# Patient Record
Sex: Female | Born: 1985 | Race: Black or African American | Hispanic: No | Marital: Single | Smoking: Former smoker
Health system: Southern US, Community
[De-identification: ages and names within clinical notes are randomized; demographics above are authoritative.]

## PROBLEM LIST (undated history)

## (undated) DIAGNOSIS — J45909 Unspecified asthma, uncomplicated: Secondary | ICD-10-CM

## (undated) DIAGNOSIS — R7303 Prediabetes: Secondary | ICD-10-CM

## (undated) DIAGNOSIS — F419 Anxiety disorder, unspecified: Secondary | ICD-10-CM

## (undated) DIAGNOSIS — J40 Bronchitis, not specified as acute or chronic: Secondary | ICD-10-CM

## (undated) DIAGNOSIS — F909 Attention-deficit hyperactivity disorder, unspecified type: Secondary | ICD-10-CM

## (undated) DIAGNOSIS — E559 Vitamin D deficiency, unspecified: Secondary | ICD-10-CM

## (undated) HISTORY — DX: Prediabetes: R73.03

## (undated) HISTORY — DX: Attention-deficit hyperactivity disorder, unspecified type: F90.9

## (undated) HISTORY — DX: Anxiety disorder, unspecified: F41.9

## (undated) HISTORY — DX: Unspecified asthma, uncomplicated: J45.909

## (undated) HISTORY — PX: NO PAST SURGERIES: SHX2092

## (undated) HISTORY — DX: Vitamin D deficiency, unspecified: E55.9

---

## 2006-02-24 ENCOUNTER — Emergency Department (HOSPITAL_COMMUNITY): Admission: EM | Admit: 2006-02-24 | Discharge: 2006-02-24 | Payer: Self-pay | Admitting: *Deleted

## 2006-07-30 ENCOUNTER — Emergency Department (HOSPITAL_COMMUNITY): Admission: EM | Admit: 2006-07-30 | Discharge: 2006-07-30 | Payer: Self-pay | Admitting: Emergency Medicine

## 2007-01-14 ENCOUNTER — Emergency Department (HOSPITAL_COMMUNITY): Admission: EM | Admit: 2007-01-14 | Discharge: 2007-01-14 | Payer: Self-pay | Admitting: Emergency Medicine

## 2014-02-02 ENCOUNTER — Emergency Department (HOSPITAL_COMMUNITY)
Admission: EM | Admit: 2014-02-02 | Discharge: 2014-02-02 | Disposition: A | Discharge status: Home / Self Care | Payer: 59 | Attending: Emergency Medicine | Admitting: Emergency Medicine

## 2014-02-02 ENCOUNTER — Encounter (HOSPITAL_COMMUNITY): Payer: Self-pay | Admitting: Emergency Medicine

## 2014-02-02 DIAGNOSIS — J302 Other seasonal allergic rhinitis: Secondary | ICD-10-CM | POA: Diagnosis present

## 2014-02-02 DIAGNOSIS — H938X9 Other specified disorders of ear, unspecified ear: Secondary | ICD-10-CM | POA: Insufficient documentation

## 2014-02-02 DIAGNOSIS — Z87891 Personal history of nicotine dependence: Secondary | ICD-10-CM | POA: Insufficient documentation

## 2014-02-02 DIAGNOSIS — L0100 Impetigo, unspecified: Secondary | ICD-10-CM | POA: Diagnosis present

## 2014-02-02 DIAGNOSIS — J309 Allergic rhinitis, unspecified: Secondary | ICD-10-CM | POA: Insufficient documentation

## 2014-02-02 MED ORDER — MUPIROCIN 2 % EX OINT: TOPICAL_OINTMENT | CUTANEOUS | Status: AC

## 2014-02-02 MED ORDER — FLUTICASONE PROPIONATE 50 MCG/ACT NA SUSP: 2.0000 | Freq: Every day | NASAL | Status: DC

## 2014-02-02 MED ORDER — CETIRIZINE HCL 10 MG PO TABS: 10.0000 mg | ORAL_TABLET | Freq: Every day | ORAL | Status: DC

## 2014-02-02 NOTE — ED Provider Notes (Signed)
CSN: [id number]     Arrival date & time 02/02/14  0754 History   First MD Initiated Contact with Patient 02/02/14 [phone]     Chief Complaint  Patient presents with  . URI     (Consider location/radiation/quality/duration/timing/severity/associated sxs/prior Treatment) Patient is a 28 y.o. female presenting with URI. The history is provided by the patient.  URI Presenting symptoms: rhinorrhea and sore throat   Presenting symptoms: no congestion, no cough, no fatigue and no fever   Rhinorrhea:    Quality:  Clear   Severity:  Moderate   Duration:  2 weeks   Timing:  Constant   Progression:  Unchanged Sore throat:    Severity:  Mild   Onset quality:  Gradual   Duration:  1 week   Timing:  Constant   Progression:  Partially resolved Severity:  Mild Onset quality:  Gradual Timing:  Constant Progression:  Partially resolved Chronicity:  New Relieved by:  Nothing Worsened by:  Nothing tried Ineffective treatments: chloraseptic spray. Associated symptoms: sneezing   Associated symptoms: no headaches and no neck pain     History reviewed. No pertinent past medical history. History reviewed. No pertinent past surgical history. No family history on file. History  Substance Use Topics  . Smoking status: Former Games developermoker  . Smokeless tobacco: Not on file  . Alcohol Use: Not on file     Comment: social   OB History   Grav Para Term Preterm Abortions TAB SAB Ect Mult Living                 Review of Systems  Constitutional: Negative for fever and fatigue.  HENT: Positive for rhinorrhea, sneezing and sore throat. Negative for congestion and drooling.   Eyes: Negative for pain.  Respiratory: Negative for cough and shortness of breath.   Cardiovascular: Negative for chest pain.  Gastrointestinal: Negative for nausea, vomiting, abdominal pain and diarrhea.  Genitourinary: Negative for dysuria and hematuria.  Musculoskeletal: Negative for back pain, gait problem and neck pain.   Skin: Negative for color change.       Lesion under nose  Neurological: Negative for dizziness and headaches.  Hematological: Negative for adenopathy.  Psychiatric/Behavioral: Negative for behavioral problems.  All other systems reviewed and are negative.     Allergies  Review of patient's allergies indicates no known allergies.  Home Medications   Prior to Admission medications   Medication Sig Start Date End Date Taking? Authorizing Provider  cetirizine (ZYRTEC) 10 MG tablet Take 1 tablet (10 mg total) by mouth daily. 02/02/14   Junius ArgyleForrest S Christepher Melchior, MD  fluticasone (FLONASE) 50 MCG/ACT nasal spray Place 2 sprays into both nostrils daily. 02/02/14 02/12/14  Junius ArgyleForrest S Dorman Calderwood, MD  mupirocin ointment (BACTROBAN) 2 % Apply to affected area 3 times daily for 5 days. 02/02/14 02/06/14  Junius ArgyleForrest S Anahy Esh, MD   BP 136/87  Pulse 99  Temp(Src) 98.8 F (37.1 C) (Oral)  Resp 12  SpO2 100%  LMP 01/10/2014 Physical Exam  Nursing note and vitals reviewed. Constitutional: She is oriented to person, place, and time. She appears well-developed and well-nourished.  HENT:  Head: Normocephalic and atraumatic.  Mouth/Throat: Oropharynx is clear and moist. No oropharyngeal exudate.  Mild erythema and swelling to the helix of the right ear.   TM's clear bilaterally.   Mildly swollen tonsils bilaterally without asymmetry.  Mildly tender anterior cervical lymphadenopathy.  Mild honey-colored crusting lesions to the left of the philtrum under the nose.  Eyes: Conjunctivae and EOM  are normal. Pupils are equal, round, and reactive to light.  Neck: Normal range of motion. Neck supple.  Cardiovascular: Normal rate, regular rhythm, normal heart sounds and intact distal pulses.  Exam reveals no gallop and no friction rub.   No murmur heard. Pulmonary/Chest: Effort normal and breath sounds normal. No respiratory distress. She has no wheezes.  Abdominal: Soft. Bowel sounds are normal. There is no  tenderness. There is no rebound and no guarding.  Musculoskeletal: Normal range of motion. She exhibits no edema and no tenderness.  Neurological: She is alert and oriented to person, place, and time.  Skin: Skin is warm and dry.  Psychiatric: She has a normal mood and affect. Her behavior is normal.    ED Course  Procedures (including critical care time) Labs Review Labs Reviewed - No data to display  Imaging Review No results found.   EKG Interpretation None      MDM   Final diagnoses:  Seasonal allergies  Impetigo    8:25 AM 28 y.o. female who presents with sore throat, dry cough, itching, rhinorrhea for approximately 1.5 weeks. She denies any fevers, vomiting, diarrhea. Sore throat now improved and almost gone. She has several honey-colored dry crusting lesions just to the left of the philtrum under her nose. She is afebrile and vital signs are unremarkable here. She's been using over-the-counter medicines with mild relief. Likely seasonal allergies. Will recommend daily Zyrtec, Flonase, and mupirocin for likely impetigo.  8:27 AM:  I have discussed the diagnosis/risks/treatment options with the patient and believe the pt to be eligible for discharge home to follow-up with pcp as needed. We also discussed returning to the ED immediately if new or worsening sx occur. We discussed the sx which are most concerning (e.g., fever, sob) that necessitate immediate return. Medications administered to the patient during their visit and any new prescriptions provided to the patient are listed below.  Medications given during this visit Medications - No data to display  New Prescriptions   CETIRIZINE (ZYRTEC) 10 MG TABLET    Take 1 tablet (10 mg total) by mouth daily.   FLUTICASONE (FLONASE) 50 MCG/ACT NASAL SPRAY    Place 2 sprays into both nostrils daily.   MUPIROCIN OINTMENT (BACTROBAN) 2 %    Apply to affected area 3 times daily for 5 days.       Junius ArgyleForrest S Karenann Mcgrory,  MD 02/02/14 0830

## 2014-02-02 NOTE — ED Notes (Signed)
Cold symptoms since Thursday; sore throat Tuesday; sore above top lip; runny nose; chest congestion

## 2014-02-02 NOTE — Discharge Instructions (Signed)
Allergic Rhinitis Allergic rhinitis is when the mucous membranes in the nose respond to allergens. Allergens are particles in the air that cause your body to have an allergic reaction. This causes you to release allergic antibodies. Through a chain of events, these eventually cause you to release histamine into the blood stream. Although meant to protect the body, it is this release of histamine that causes your discomfort, such as frequent sneezing, congestion, and an itchy, runny nose.  CAUSES  Seasonal allergic rhinitis (hay fever) is caused by pollen allergens that may come from grasses, trees, and weeds. Year-round allergic rhinitis (perennial allergic rhinitis) is caused by allergens such as house dust mites, pet dander, and mold spores.  SYMPTOMS   Nasal stuffiness (congestion).  Itchy, runny nose with sneezing and tearing of the eyes. DIAGNOSIS  Your health care provider can help you determine the allergen or allergens that trigger your symptoms. If you and your health care provider are unable to determine the allergen, skin or blood testing may be used. TREATMENT  Allergic Rhinitis does not have a cure, but it can be controlled by:  Medicines and allergy shots (immunotherapy).  Avoiding the allergen. Hay fever may often be treated with antihistamines in pill or nasal spray forms. Antihistamines block the effects of histamine. There are over-the-counter medicines that may help with nasal congestion and swelling around the eyes. Check with your health care provider before taking or giving this medicine.  If avoiding the allergen or the medicine prescribed do not work, there are many new medicines your health care provider can prescribe. Stronger medicine may be used if initial measures are ineffective. Desensitizing injections can be used if medicine and avoidance does not work. Desensitization is when a patient is given ongoing shots until the body becomes less sensitive to the allergen.  Make sure you follow up with your health care provider if problems continue. HOME CARE INSTRUCTIONS It is not possible to completely avoid allergens, but you can reduce your symptoms by taking steps to limit your exposure to them. It helps to know exactly what you are allergic to so that you can avoid your specific triggers. SEEK MEDICAL CARE IF:   You have a fever.  You develop a cough that does not stop easily (persistent).  You have shortness of breath.  You start wheezing.  Symptoms interfere with normal daily activities. Document Released: 06/24/2001 Document Revised: 07/20/2013 Document Reviewed: 06/06/2013 Exit[employer] Patient Information 2014 [employer].  

## 2014-03-29 ENCOUNTER — Encounter: Payer: Self-pay | Admitting: *Deleted

## 2014-04-19 ENCOUNTER — Encounter: Payer: Self-pay | Admitting: Obstetrics and Gynecology

## 2014-05-04 ENCOUNTER — Encounter (HOSPITAL_COMMUNITY): Payer: Self-pay | Admitting: Emergency Medicine

## 2014-05-04 ENCOUNTER — Emergency Department (HOSPITAL_COMMUNITY)
Admission: EM | Admit: 2014-05-04 | Discharge: 2014-05-04 | Disposition: A | Discharge status: Home / Self Care | Payer: 59 | Attending: Emergency Medicine | Admitting: Emergency Medicine

## 2014-05-04 DIAGNOSIS — Z8709 Personal history of other diseases of the respiratory system: Secondary | ICD-10-CM | POA: Insufficient documentation

## 2014-05-04 DIAGNOSIS — Z87891 Personal history of nicotine dependence: Secondary | ICD-10-CM | POA: Insufficient documentation

## 2014-05-04 DIAGNOSIS — Z79899 Other long term (current) drug therapy: Secondary | ICD-10-CM | POA: Insufficient documentation

## 2014-05-04 DIAGNOSIS — L509 Urticaria, unspecified: Secondary | ICD-10-CM

## 2014-05-04 DIAGNOSIS — IMO0002 Reserved for concepts with insufficient information to code with codable children: Secondary | ICD-10-CM | POA: Insufficient documentation

## 2014-05-04 HISTORY — DX: Bronchitis, not specified as acute or chronic: J40

## 2014-05-04 MED ORDER — DEXAMETHASONE SODIUM PHOSPHATE 10 MG/ML IJ SOLN
10.0000 mg | Freq: Once | INTRAMUSCULAR | Status: AC
  Administered 2014-05-04: 10 mg via INTRAMUSCULAR
  Filled 2014-05-04: qty 1

## 2014-05-04 NOTE — ED Provider Notes (Signed)
Medical screening examination/treatment/procedure(s) were performed by non-physician practitioner and as supervising physician I was immediately available for consultation/collaboration.   EKG Interpretation None       Charbel Los M Shalae Belmonte, MD 05/04/14 0555 

## 2014-05-04 NOTE — ED Notes (Signed)
Pt from home c/o a itch rash that started today at work around 1400. She took two benadryl at 9:00 pm and calamine lotion with minimal relief. She can't recall any new items that she has used.

## 2014-05-04 NOTE — Discharge Instructions (Signed)
Continue to take Benadryl for itching. Followup with a primary care provider for continued evaluation and possible allergy testing.    Hives Hives are itchy, red, swollen areas of the skin. They can vary in size and location on your body. Hives can come and go for hours or several days (acute hives) or for several weeks (chronic hives). Hives do not spread from person to person (noncontagious). They may get worse with scratching, exercise, and emotional stress. CAUSES   Allergic reaction to food, additives, or drugs.  Infections, including the common cold.  Illness, such as vasculitis, lupus, or thyroid disease.  Exposure to sunlight, heat, or cold.  Exercise.  Stress.  Contact with chemicals. SYMPTOMS   Red or white swollen patches on the skin. The patches may change size, shape, and location quickly and repeatedly.  Itching.  Swelling of the hands, feet, and face. This may occur if hives develop deeper in the skin. DIAGNOSIS  Your caregiver can usually tell what is wrong by performing a physical exam. Skin or blood tests may also be done to determine the cause of your hives. In some cases, the cause cannot be determined. TREATMENT  Mild cases usually get better with medicines such as antihistamines. Severe cases may require an emergency epinephrine injection. If the cause of your hives is known, treatment includes avoiding that trigger.  HOME CARE INSTRUCTIONS   Avoid causes that trigger your hives.  Take antihistamines as directed by your caregiver to reduce the severity of your hives. Non-sedating or low-sedating antihistamines are usually recommended. Do not drive while taking an antihistamine.  Take any other medicines prescribed for itching as directed by your caregiver.  Wear loose-fitting clothing.  Keep all follow-up appointments as directed by your caregiver. SEEK MEDICAL CARE IF:   You have persistent or severe itching that is not relieved with  medicine.  You have painful or swollen joints. SEEK IMMEDIATE MEDICAL CARE IF:   You have a fever.  Your tongue or lips are swollen.  You have trouble breathing or swallowing.  You feel tightness in the throat or chest.  You have abdominal pain. These problems may be the first sign of a life-threatening allergic reaction. Call your local emergency services (911 in [place] MAKE SURE YOU:   Understand these instructions.  Will watch your condition.  Will get help right away if you are not doing well or get worse. Document Released: 09/29/2005 Document Revised: 10/04/2013 Document Reviewed: 12/23/2011 [facility] LP[facility] Patient Information 2015 [place], [place]. This information is not intended to replace advice given to you by your health care provider. Make sure you discuss any questions you have with your health care provider.

## 2014-05-04 NOTE — ED Provider Notes (Signed)
CSN: [id number]     Arrival date & time 05/04/14  0008 History   First MD Initiated Contact with Patient 05/04/14 0130     Chief Complaint  Patient presents with  . Rash   HPI  History provided by the patient. Patient is a 28 year old female with no significant PMH presents with concerns for pruritic rash to the skin. Patient reports having slight itching of the skin towards the end of her workday. When she returned home she had much more itching to her upper body and extremities. She did take some Benadryl in a warm shower. After her shower she had worsened itching and rash to the skin. She did put calamine lotion on but has not had any significant relief. She denies any similar symptoms previously. Denies any known allergies. She states that she had a typical meal for lunch. She does recall using a lavender airspray while at work but states she uses this frequently to help with odors. Denies any new soaps, lotions or clothing. Denies any associated tightness or swelling of the throat, lips or tongue. No shortness of breath.    Past Medical History  Diagnosis Date  . Bronchitis    History reviewed. No pertinent past surgical history. No family history on file. History  Substance Use Topics  . Smoking status: Former Games developermoker  . Smokeless tobacco: Not on file  . Alcohol Use: Yes     Comment: social   OB History   Grav Para Term Preterm Abortions TAB SAB Ect Mult Living                 Review of Systems  Constitutional: Negative for fever, chills and diaphoresis.  Respiratory: Negative for shortness of breath and wheezing.   Cardiovascular: Negative for chest pain.  Skin: Positive for rash.  All other systems reviewed and are negative.     Allergies  Review of patient's allergies indicates no known allergies.  Home Medications   Prior to Admission medications   Medication Sig Start Date End Date Taking? Authorizing Provider  albuterol (PROVENTIL HFA;VENTOLIN HFA) 108 (90  BASE) MCG/ACT inhaler Inhale 1 puff into the lungs every 6 (six) hours as needed for wheezing or shortness of breath.   Yes Historical Provider, MD  cetirizine (ZYRTEC) 10 MG tablet Take 10 mg by mouth daily as needed for allergies.   Yes Historical Provider, MD  etonogestrel (IMPLANON) 68 MG IMPL implant Inject 1 each into the skin continuous.   Yes Historical Provider, MD  Multiple Vitamin (MULTIVITAMIN WITH MINERALS) TABS tablet Take 1 tablet by mouth daily.   Yes Historical Provider, MD  tetrahydrozoline 0.05 % ophthalmic solution Place 1 drop into both eyes daily as needed. For eye irritation   Yes Historical Provider, MD  fluticasone (FLONASE) 50 MCG/ACT nasal spray Place 2 sprays into both nostrils daily. 02/02/14 02/12/14  Junius ArgyleForrest S Harrison, MD  oxymetazoline (AFRIN) 0.05 % nasal spray Place 1 spray into both nostrils 2 (two) times daily as needed for congestion.     Historical Provider, MD   BP 133/64  Pulse 72  Temp(Src) 98.2 F (36.8 C) (Oral)  Resp 16  Ht 5\' 7"  (1.702 m)  Wt 302 lb (136.986 kg)  BMI 47.29 kg/m2  SpO2 100%  LMP 04/04/2014 Physical Exam  Nursing note and vitals reviewed. Constitutional: She is oriented to person, place, and time. She appears well-developed and well-nourished. No distress.  HENT:  Head: Normocephalic.  Mouth/Throat: Oropharynx is clear and moist.  Neck: Normal  range of motion. Neck supple.  Cardiovascular: Normal rate and regular rhythm.   Pulmonary/Chest: Effort normal and breath sounds normal. No stridor. No respiratory distress. She has no wheezes. She has no rales.  Abdominal: Soft.  Neurological: She is alert and oriented to person, place, and time.  Skin: Skin is warm and dry. Rash noted.  Urticarial type rash covering the extremities, chest and abdomen  Psychiatric: She has a normal mood and affect. Her behavior is normal.    ED Course  Procedures   COORDINATION OF CARE:  Nursing notes reviewed. Vital signs reviewed. Initial pt  interview and examination performed.   Filed Vitals:   05/04/14 0023 05/04/14 0148  BP: 140/77 133/64  Pulse: 88 72  Temp: 98.2 F (36.8 C)   TempSrc: Oral   Resp: 18 16  Height: 5\' 7"  (1.702 m)   Weight: 302 lb (136.986 kg)   SpO2: 100% 100%    1:57 AM-patient seen and evaluated. Rash consistent with hives. No significant improvement after Benadryl. Will give IM dose of Decadron.  Treatment plan initiated: Medications  dexamethasone (DECADRON) injection 10 mg (10 mg Intramuscular Given 05/04/14 0145)       MDM   Final diagnoses:  Hives       Angus Seller, PA-C 05/04/14 [phone]

## 2014-05-04 NOTE — ED Notes (Signed)
PA at bedside.

## 2014-05-05 ENCOUNTER — Emergency Department (HOSPITAL_COMMUNITY)
Admission: EM | Admit: 2014-05-05 | Discharge: 2014-05-05 | Disposition: A | Discharge status: Home / Self Care | Payer: 59 | Attending: Emergency Medicine | Admitting: Emergency Medicine

## 2014-05-05 ENCOUNTER — Encounter (HOSPITAL_COMMUNITY): Payer: Self-pay | Admitting: Emergency Medicine

## 2014-05-05 DIAGNOSIS — IMO0002 Reserved for concepts with insufficient information to code with codable children: Secondary | ICD-10-CM | POA: Insufficient documentation

## 2014-05-05 DIAGNOSIS — T7840XD Allergy, unspecified, subsequent encounter: Secondary | ICD-10-CM

## 2014-05-05 DIAGNOSIS — L298 Other pruritus: Secondary | ICD-10-CM | POA: Insufficient documentation

## 2014-05-05 DIAGNOSIS — Z87891 Personal history of nicotine dependence: Secondary | ICD-10-CM | POA: Insufficient documentation

## 2014-05-05 DIAGNOSIS — L2989 Other pruritus: Secondary | ICD-10-CM | POA: Insufficient documentation

## 2014-05-05 DIAGNOSIS — T498X5A Adverse effect of other topical agents, initial encounter: Secondary | ICD-10-CM | POA: Insufficient documentation

## 2014-05-05 DIAGNOSIS — L509 Urticaria, unspecified: Secondary | ICD-10-CM | POA: Insufficient documentation

## 2014-05-05 DIAGNOSIS — Z79899 Other long term (current) drug therapy: Secondary | ICD-10-CM | POA: Insufficient documentation

## 2014-05-05 MED ORDER — FAMOTIDINE 20 MG PO TABS
40.0000 mg | ORAL_TABLET | Freq: Once | ORAL | Status: AC
  Administered 2014-05-05: 40 mg via ORAL
  Filled 2014-05-05: qty 2

## 2014-05-05 MED ORDER — DIPHENHYDRAMINE-ZINC ACETATE 1-0.1 % EX CREA: TOPICAL_CREAM | Freq: Three times a day (TID) | CUTANEOUS | Status: DC | PRN

## 2014-05-05 MED ORDER — PREDNISONE 20 MG PO TABS
60.0000 mg | ORAL_TABLET | Freq: Once | ORAL | Status: AC
  Administered 2014-05-05: 60 mg via ORAL
  Filled 2014-05-05: qty 3

## 2014-05-05 MED ORDER — PREDNISONE 20 MG PO TABS: 60.0000 mg | ORAL_TABLET | Freq: Every day | ORAL | Status: DC

## 2014-05-05 MED ORDER — DIPHENHYDRAMINE HCL 25 MG PO CAPS
25.0000 mg | ORAL_CAPSULE | Freq: Once | ORAL | Status: AC
  Administered 2014-05-05: 25 mg via ORAL
  Filled 2014-05-05: qty 1

## 2014-05-05 MED ORDER — DIPHENHYDRAMINE HCL 25 MG PO TABS: 25.0000 mg | ORAL_TABLET | Freq: Four times a day (QID) | ORAL | Status: DC | PRN

## 2014-05-05 MED ORDER — FAMOTIDINE 20 MG PO TABS: 20.0000 mg | ORAL_TABLET | Freq: Two times a day (BID) | ORAL | Status: DC

## 2014-05-05 NOTE — Discharge Instructions (Signed)
Please follow up with your primary care physician in 1-2 days. If you do not have one please call the [facility] number listed above. Please take medications as prescribed. Please read all discharge instructions and return precautions.  ° ° °Allergies °Allergies may happen from anything your body is sensitive to. This may be food, medicines, pollens, chemicals, and nearly anything around you in everyday life that produces allergens. An allergen is anything that causes an allergy producing substance. Heredity is often a factor in causing these problems. This means you may have some of the same allergies as your parents. °Food allergies happen in all age groups. Food allergies are some of the most severe and life threatening. Some common food allergies are cow's milk, seafood, eggs, nuts, wheat, and soybeans. °SYMPTOMS  °· Swelling around the mouth. °· An itchy red rash or hives. °· Vomiting or diarrhea. °· Difficulty breathing. °SEVERE ALLERGIC REACTIONS ARE LIFE-THREATENING. °This reaction is called anaphylaxis. It can cause the mouth and throat to swell and cause difficulty with breathing and swallowing. In severe reactions only a trace amount of food (for example, peanut oil in a salad) may cause death within seconds. °Seasonal allergies occur in all age groups. These are seasonal because they usually occur during the same season every year. They may be a reaction to molds, grass pollens, or tree pollens. Other causes of problems are house dust mite allergens, pet dander, and mold spores. The symptoms often consist of nasal congestion, a runny itchy nose associated with sneezing, and tearing itchy eyes. There is often an associated itching of the mouth and ears. The problems happen when you come in contact with pollens and other allergens. Allergens are the particles in the air that the body reacts to with an allergic reaction. This causes you to release allergic antibodies. Through a chain of  events, these eventually cause you to release histamine into the blood stream. Although it is meant to be protective to the body, it is this release that causes your discomfort. This is why you were given anti-histamines to feel better.  If you are unable to pinpoint the offending allergen, it may be determined by skin or blood testing. Allergies cannot be cured but can be controlled with medicine. °Hay fever is a collection of all or some of the seasonal allergy problems. It may often be treated with simple over-the-counter medicine such as diphenhydramine. Take medicine as directed. Do not drink alcohol or drive while taking this medicine. Check with your caregiver or package insert for child dosages. °If these medicines are not effective, there are many new medicines your caregiver can prescribe. Stronger medicine such as nasal spray, eye drops, and corticosteroids may be used if the first things you try do not work well. Other treatments such as immunotherapy or desensitizing injections can be used if all else fails. Follow up with your caregiver if problems continue. These seasonal allergies are usually not life threatening. They are generally more of a nuisance that can often be handled using medicine. °HOME CARE INSTRUCTIONS  °· If unsure what causes a reaction, keep a diary of foods eaten and symptoms that follow. Avoid foods that cause reactions. °· If hives or rash are present: °¨ Take medicine as directed. °¨ You may use an over-the-counter antihistamine (diphenhydramine) for hives and itching as needed. °¨ Apply cold compresses (cloths) to the skin or take baths in cool water. Avoid hot baths or showers. Heat will make a rash and itching worse. °·   If you are severely allergic: °¨ Following a treatment for a severe reaction, hospitalization is often required for closer follow-up. °¨ Wear a medic-alert bracelet or necklace stating the allergy. °¨ You and your family must learn how to give adrenaline or use  an anaphylaxis kit. °¨ If you have had a severe reaction, always carry your anaphylaxis kit or EpiPen® with you. Use this medicine as directed by your caregiver if a severe reaction is occurring. Failure to do so could have a fatal outcome. °SEEK MEDICAL CARE IF: °· You suspect a food allergy. Symptoms generally happen within 30 minutes of eating a food. °· Your symptoms have not gone away within 2 days or are getting worse. °· You develop new symptoms. °· You want to retest yourself or your child with a food or drink you think causes an allergic reaction. Never do this if an anaphylactic reaction to that food or drink has happened before. Only do this under the care of a caregiver. °SEEK IMMEDIATE MEDICAL CARE IF:  °· You have difficulty breathing, are wheezing, or have a tight feeling in your chest or throat. °· You have a swollen mouth, or you have hives, swelling, or itching all over your body. °· You have had a severe reaction that has responded to your anaphylaxis kit or an EpiPen®. These reactions may return when the medicine has worn off. These reactions should be considered life threatening. °MAKE SURE YOU:  °· Understand these instructions. °· Will watch your condition. °· Will get help right away if you are not doing well or get worse. °Document Released: 12/23/2002 Document Revised: 01/24/2013 Document Reviewed: 05/29/2008 °Exit[employer]® Patient Information ©2015 [employer]. This information is not intended to replace advice given to you by your health care provider. Make sure you discuss any questions you have with your health care provider. ° ° °

## 2014-05-05 NOTE — ED Notes (Signed)
Per pt report: Pt was seen Wednesday in the ED for her itching and was given a steroid shot.  Pt was dx with hives.  Pt called her PCP office and was put on prednisone. Pt took the prednisone and took a cold shower with black soap.  Pt then put on calamine lotion and went to bed.  Pt woke up at 2am with burning and itching sensation all over.  On arrival to ED, pt has hives all over her body.  Pt ambulatory.

## 2014-05-05 NOTE — Progress Notes (Signed)
[facility] provided pt with a list of primary care resources and a [facility] [employer] Card application to help patient establish primary care.  °

## 2014-05-05 NOTE — ED Provider Notes (Signed)
CSN: [id number]     Arrival date & time 05/05/14  0531 History   First MD Initiated Contact with Patient 05/05/14 0606     Chief Complaint  Patient presents with  . Pruritis  . Urticaria     (Consider location/radiation/quality/duration/timing/severity/associated sxs/prior Treatment) HPI Comments: Patient is a 28 yo F PMHx significant for hx of tobacco abuse presenting to the ED for continued hives and burning pruritis after being seen on Wednesday. Patient states she was placed on prednisone by her PCP and has taken one dose (yesterday). She has also taken two doses of Benadryl, without improvement. Patient does endorse using new lotion prior to onset of rash, states she forgot about this when she was in the emergency department on Wednesday. Denies any lip or tongue swelling, shortness of breath, nausea, vomiting.   Patient is a 28 y.o. female presenting with urticaria.  Urticaria Associated symptoms include a rash. Pertinent negatives include no abdominal pain, chills, fever, nausea or vomiting.    Past Medical History  Diagnosis Date  . Bronchitis    History reviewed. No pertinent past surgical history. No family history on file. History  Substance Use Topics  . Smoking status: Former Games developer  . Smokeless tobacco: Not on file  . Alcohol Use: Yes     Comment: social   OB History   Grav Para Term Preterm Abortions TAB SAB Ect Mult Living                 Review of Systems  Constitutional: Negative for fever and chills.  Respiratory: Negative for shortness of breath and wheezing.   Gastrointestinal: Negative for nausea, vomiting and abdominal pain.  Skin: Positive for rash.  All other systems reviewed and are negative.     Allergies  Review of patient's allergies indicates no known allergies.  Home Medications   Prior to Admission medications   Medication Sig Start Date End Date Taking? Authorizing Provider  albuterol (PROVENTIL HFA;VENTOLIN HFA) 108 (90 BASE)  MCG/ACT inhaler Inhale 1 puff into the lungs every 6 (six) hours as needed for wheezing or shortness of breath.   Yes Historical Provider, MD  etonogestrel (IMPLANON) 68 MG IMPL implant Inject 1 each into the skin continuous.   Yes Historical Provider, MD  tetrahydrozoline 0.05 % ophthalmic solution Place 1 drop into both eyes daily as needed. For eye irritation   Yes Historical Provider, MD  diphenhydrAMINE (BENADRYL) 25 MG tablet Take 1 tablet (25 mg total) by mouth every 6 (six) hours as needed for itching (Rash). 05/05/14   Nadia Torr L Savhanna Sliva, PA-C  diphenhydrAMINE-zinc acetate (BENADRYL) cream Apply topically 3 (three) times daily as needed for itching. 05/05/14   Victorino Dike L Laiylah Roettger, PA-C  famotidine (PEPCID) 20 MG tablet Take 1 tablet (20 mg total) by mouth 2 (two) times daily. 05/05/14   Daveon Arpino L Maely Clements, PA-C  predniSONE (DELTASONE) 20 MG tablet Take 3 tablets (60 mg total) by mouth daily. 05/05/14   Holliday Sheaffer L Coal Nearhood, PA-C   BP 137/71  Pulse 75  Temp(Src) 97.4 F (36.3 C) (Oral)  Resp 18  SpO2 100%  LMP 04/04/2014 Physical Exam  Nursing note and vitals reviewed. Constitutional: She is oriented to person, place, and time. She appears well-developed and well-nourished. No distress.  HENT:  Head: Normocephalic and atraumatic.  Right Ear: External ear normal.  Left Ear: External ear normal.  Nose: Nose normal.  Mouth/Throat: Uvula is midline, oropharynx is clear and moist and mucous membranes are normal. No uvula swelling. No  posterior oropharyngeal edema.  Eyes: Conjunctivae are normal.  Neck: Normal range of motion. Neck supple.  Cardiovascular: Normal rate.   Pulmonary/Chest: Effort normal.  Abdominal: Soft.  Musculoskeletal: Normal range of motion.  Neurological: She is alert and oriented to person, place, and time.  Skin: Skin is warm and dry. Rash noted. Rash is urticarial (arms, legs). She is not diaphoretic.  Psychiatric: She has a normal mood and affect.     ED Course  Procedures (including critical care time) Medications  diphenhydrAMINE (BENADRYL) capsule 25 mg (25 mg Oral Given 05/05/14 0638)  famotidine (PEPCID) tablet 40 mg (40 mg Oral Given 05/05/14 0713)  predniSONE (DELTASONE) tablet 60 mg (60 mg Oral Given 05/05/14 [id number])    Labs Review Labs Reviewed - No data to display  Imaging Review No results found.   EKG Interpretation None      MDM   Final diagnoses:  Allergic reaction, subsequent encounter    Filed Vitals:   05/05/14 0850  BP: 137/71  Pulse: 75  Temp:   Resp: 18   Afebrile, NAD, non-toxic appearing, AAOx4.  Patient re-evaluated prior to dc, is hemodynamically stable, in no respiratory distress, and denies the feeling of throat closing. Pt has been advised to take OTC benadryl & return to the ED if they have a mod-severe allergic rxn (s/s including throat closing, difficulty breathing, swelling of lips face or tongue). Pt is to follow up with their PCP. Pt is agreeable with plan & verbalizes understanding. Patient is stable at time of discharge     Jeannetta EllisJennifer L Tekia Waterbury, PA-C 05/05/14 1217

## 2014-05-07 ENCOUNTER — Emergency Department (HOSPITAL_COMMUNITY)
Admission: EM | Admit: 2014-05-07 | Discharge: 2014-05-07 | Disposition: A | Discharge status: Home / Self Care | Payer: 59 | Attending: Emergency Medicine | Admitting: Emergency Medicine

## 2014-05-07 ENCOUNTER — Encounter (HOSPITAL_COMMUNITY): Payer: Self-pay | Admitting: Emergency Medicine

## 2014-05-07 DIAGNOSIS — Z87891 Personal history of nicotine dependence: Secondary | ICD-10-CM | POA: Insufficient documentation

## 2014-05-07 DIAGNOSIS — Z79899 Other long term (current) drug therapy: Secondary | ICD-10-CM | POA: Insufficient documentation

## 2014-05-07 DIAGNOSIS — R21 Rash and other nonspecific skin eruption: Secondary | ICD-10-CM | POA: Insufficient documentation

## 2014-05-07 DIAGNOSIS — L509 Urticaria, unspecified: Secondary | ICD-10-CM | POA: Insufficient documentation

## 2014-05-07 DIAGNOSIS — Z8709 Personal history of other diseases of the respiratory system: Secondary | ICD-10-CM | POA: Insufficient documentation

## 2014-05-07 DIAGNOSIS — Z566 Other physical and mental strain related to work: Secondary | ICD-10-CM

## 2014-05-07 DIAGNOSIS — R0989 Other specified symptoms and signs involving the circulatory and respiratory systems: Secondary | ICD-10-CM | POA: Insufficient documentation

## 2014-05-07 DIAGNOSIS — F430 Acute stress reaction: Secondary | ICD-10-CM | POA: Insufficient documentation

## 2014-05-07 DIAGNOSIS — R0609 Other forms of dyspnea: Secondary | ICD-10-CM | POA: Insufficient documentation

## 2014-05-07 LAB — COMPREHENSIVE METABOLIC PANEL
ALK PHOS: 75 U/L (ref 39–117)
ALT: 5 U/L (ref 0–35)
AST: 12 U/L (ref 0–37)
Albumin: 3.3 g/dL — ABNORMAL LOW (ref 3.5–5.2)
Anion gap: 14 (ref 5–15)
BUN: 15 mg/dL (ref 6–23)
CO2: 21 mEq/L (ref 19–32)
Calcium: 9.2 mg/dL (ref 8.4–10.5)
Chloride: 101 mEq/L (ref 96–112)
Creatinine, Ser: 0.75 mg/dL (ref 0.50–1.10)
GFR calc non Af Amer: >90 mL/min (ref >90)
GLUCOSE: 95 mg/dL (ref 70–99)
POTASSIUM: 3.3 meq/L — AB (ref 3.7–5.3)
SODIUM: 136 meq/L — AB (ref 137–147)
TOTAL PROTEIN: 7.3 g/dL (ref 6.0–8.3)
Total Bilirubin: 0.5 mg/dL (ref 0.3–1.2)

## 2014-05-07 LAB — CBC WITH DIFFERENTIAL/PLATELET
Basophils Absolute: 0 10*3/uL (ref 0.0–0.1)
Basophils Relative: 0 % (ref 0–1)
EOS ABS: 0 10*3/uL (ref 0.0–0.7)
Eosinophils Relative: 0 % (ref 0–5)
HCT: 36.8 % (ref 36.0–46.0)
HEMOGLOBIN: 12.3 g/dL (ref 12.0–15.0)
LYMPHS ABS: 3 10*3/uL (ref 0.7–4.0)
LYMPHS PCT: 19 % (ref 12–46)
MCH: 27.5 pg (ref 26.0–34.0)
MCHC: 33.4 g/dL (ref 30.0–36.0)
MCV: 82.1 fL (ref 78.0–100.0)
MONO ABS: 1.3 10*3/uL — AB (ref 0.1–1.0)
MONOS PCT: 8 % (ref 3–12)
Neutro Abs: 11.8 10*3/uL — ABNORMAL HIGH (ref 1.7–7.7)
Neutrophils Relative %: 73 % (ref 43–77)
Platelets: 343 10*3/uL (ref 150–400)
RBC: 4.48 MIL/uL (ref 3.87–5.11)
RDW: 13.6 % (ref 11.5–15.5)
WBC: 16 10*3/uL — AB (ref 4.0–10.5)

## 2014-05-07 LAB — URINALYSIS, ROUTINE W REFLEX MICROSCOPIC
BILIRUBIN URINE: NEGATIVE
Glucose, UA: NEGATIVE mg/dL
HGB URINE DIPSTICK: NEGATIVE
Ketones, ur: NEGATIVE mg/dL
Leukocytes, UA: NEGATIVE
Nitrite: NEGATIVE
Protein, ur: NEGATIVE mg/dL
Specific Gravity, Urine: 1.026 (ref 1.005–1.030)
UROBILINOGEN UA: 1 mg/dL (ref 0.0–1.0)
pH: 6 (ref 5.0–8.0)

## 2014-05-07 MED ORDER — LORAZEPAM 2 MG/ML IJ SOLN
1.0000 mg | Freq: Once | INTRAMUSCULAR | Status: AC
  Administered 2014-05-07: 1 mg via INTRAVENOUS
  Filled 2014-05-07: qty 1

## 2014-05-07 MED ORDER — POTASSIUM CHLORIDE CRYS ER 20 MEQ PO TBCR
40.0000 meq | EXTENDED_RELEASE_TABLET | Freq: Once | ORAL | Status: AC
  Administered 2014-05-07: 40 meq via ORAL
  Filled 2014-05-07: qty 2

## 2014-05-07 MED ORDER — EPINEPHRINE 0.3 MG/0.3ML IJ SOAJ
0.3000 mg | Freq: Once | INTRAMUSCULAR | Status: AC
  Administered 2014-05-07: 0.3 mg via SUBCUTANEOUS
  Filled 2014-05-07: qty 0.3

## 2014-05-07 MED ORDER — DIPHENHYDRAMINE HCL 50 MG/ML IJ SOLN
50.0000 mg | Freq: Once | INTRAMUSCULAR | Status: AC
  Administered 2014-05-07: 50 mg via INTRAVENOUS
  Filled 2014-05-07: qty 1

## 2014-05-07 MED ORDER — FAMOTIDINE IN NACL 20-0.9 MG/50ML-% IV SOLN
20.0000 mg | Freq: Once | INTRAVENOUS | Status: AC
  Administered 2014-05-07: 20 mg via INTRAVENOUS
  Filled 2014-05-07: qty 50

## 2014-05-07 MED ORDER — LORAZEPAM 1 MG PO TABS: 1.0000 mg | ORAL_TABLET | Freq: Three times a day (TID) | ORAL | Status: DC | PRN

## 2014-05-07 NOTE — ED Notes (Addendum)
LAB: PT WILL STILL NEED A CBC DRAWN. PER ED NP GAIL, OK TO HOLD OFF ON CBC DUE TO DIFFICULTY.

## 2014-05-07 NOTE — ED Provider Notes (Signed)
  Face-to-face evaluation   History: She reports intermittent hives, for one week, despite being treated with prednisone, Benadryl, and Pepcid. She also reports significant stress, both at work, at home for one year. She feels like the rash has even increased her stress. She further reports after treatment with Ativan in the emergency department, that she feels better.  Physical exam: Alert, calm, cooperative. There is no oral angioedema. Her airway is intact. There is no evidence for rash, at this time (11:30).  Medications  EPINEPHrine (EPI-PEN) injection 0.3 mg (0.3 mg Subcutaneous Given 05/07/14 0534)  potassium chloride SA (K-DUR,KLOR-CON) CR tablet 40 mEq (40 mEq Oral Given 05/07/14 0821)  diphenhydrAMINE (BENADRYL) injection 50 mg (50 mg Intravenous Given 05/07/14 0925)  famotidine (PEPCID) IVPB 20 mg (20 mg Intravenous New Bag/Given 05/07/14 0935)  LORazepam (ATIVAN) injection 1 mg (1 mg Intravenous Given 05/07/14 0935)    Patient Vitals for the past 24 hrs:  BP Temp Temp src Pulse Resp SpO2 Height Weight  05/07/14 0939 116/58 mmHg - - 99 20 100 % - -  05/07/14 0608 157/60 mmHg - - 102 18 100 % - -  05/07/14 0438 - - - - - - 5\' 7"  (1.702 m) 295 lb (133.811 kg)  05/07/14 0433 142/86 mmHg 97.7 F (36.5 C) Oral - 18 98 % - -    Assessment: Hives, etiology, nonspecific. The patient has received appropriate treatment with antihistamines, without relief of her hives. Prednisone, is not a usual treatment for hives. There is no evidence for anaphylaxis angioedema or serious allergic reaction. I suspect that her hives are related to her stress.  Plan: Discontinue antihistamines, and prednisone, treat hives with Ativan. There is no indication for further emergency Department treatment, or hospitalization, at this time. She has a primary care doctor to followup with that doctor can evaluate and treat her, will refer her to an allergist or therapist, for further evaluation and  treatment.   Medical screening examination/treatment/procedure(s) were conducted as a shared visit with non-physician practitioner(s) and myself.  I personally evaluated the patient during the encounter  Flint MelterElliott L Kylyn Mcdade, MD 05/08/14 1159

## 2014-05-07 NOTE — ED Provider Notes (Signed)
Medical screening examination/treatment/procedure(s) were performed by non-physician practitioner and as supervising physician I was immediately available for consultation/collaboration.   EKG Interpretation None        Loren Raceravid Delonta Yohannes, MD 05/07/14 [phone]

## 2014-05-07 NOTE — ED Notes (Signed)
Pt c/o purutic hives to entire body, x 4 days, pt has been seen multiple times with unkn allergin. Pt states she has has been taking prednisone, benadryl and Pepcid. Pt [facility]

## 2014-05-07 NOTE — ED Provider Notes (Signed)
Medical screening examination/treatment/procedure(s) were performed by non-physician practitioner and as supervising physician I was immediately available for consultation/collaboration.   EKG Interpretation None       Derwood KaplanAnkit Dustee Bottenfield, MD 05/07/14 2313

## 2014-05-07 NOTE — ED Provider Notes (Signed)
Patient is a 28 y.o. Female who was signed out to me today at shift change by Sharen HonesGail Schultz.  Patient presents to the ED with 4 days of urticarial rash secondary to an unknown allergy.  Patient was seen by her PCP and placed on prednisone 10 mg.  Patient continued to have worsening of urticarial rash which was "everywhere" on her body.  Patient was seen here in the ED on 05/05/14 with worsening of symptoms.  She was placed on a stronger steroid taper, given pepcid, and benadryl.  Patient comes back into the ED with swelling of the hands and feet, extreme erythema, chest tightness, and feeling of throat swelling.  Patient was treated here in the ED with Epi for subjective allergic reaction.  CMP shows mild hypokalemia here.  CBC shows leukocytosis which may be related to prednisone usage.  UA is without signs of infection at this time.    6:10 am Patient is awake, alert, and oriented at this time.  She is an obese black female NAD.  Oropharynx is clear with no signs of swelling or exudate at this time.  Lungs are clear to ascultation anteriorly and posteriorly.  Heart is regular rate and rhythm.  There is mild erythema of the soles of the feet bilaterally.  There are no signs of urticarial rash at this time.    9:38 am I was called back into the room as the patient felt like she was having an allergic reaction again.  On examination the patient was found to have urticarial lesions on the arms, inner thighs, and back.  Oropharynx is clear without obvious signs of erythema, swelling, or exudate.  Lungs were clear to ascultation anteriorly and posteriorly.  Patient is extremely anxious.  Pepcid, Benadryl, and Ativan were ordered at this time.    10:40 am Patient states that she is feeling improved after IV medications ordered above.  Urticarial lesions on arms and inner thighs have improved at this time. I have informed her of the plan at this time.  She asked to speak with Dr. Effie ShyWentz at this time.  I have spoken  with Dr. Effie ShyWentz at this time who feels that the patient should not be admitted to the hospital, but should likely have a referral to an allergist and also to dermatology for further work-up and treatment of urticaria.      Dr. Effie ShyWentz has spoken to the patient.  She is stable for discharge at this time.    Damain Broadus A Forcucci, PA-C 05/07/14 1200

## 2014-05-07 NOTE — ED Provider Notes (Signed)
Medical screening examination/treatment/procedure(s) were performed by non-physician practitioner and as supervising physician I was immediately available for consultation/collaboration.   EKG Interpretation None       Quaneisha Hanisch, MD 05/07/14 2313 

## 2014-05-07 NOTE — Discharge Instructions (Signed)
Hives Hives are itchy, red, swollen areas of the skin. They can vary in size and location on your body. Hives can come and go for hours or several days (acute hives) or for several weeks (chronic hives). Hives do not spread from person to person (noncontagious). They may get worse with scratching, exercise, and emotional stress. CAUSES   Allergic reaction to food, additives, or drugs.  Infections, including the common cold.  Illness, such as vasculitis, lupus, or thyroid disease.  Exposure to sunlight, heat, or cold.  Exercise.  Stress.  Contact with chemicals. SYMPTOMS   Red or white swollen patches on the skin. The patches may change size, shape, and location quickly and repeatedly.  Itching.  Swelling of the hands, feet, and face. This may occur if hives develop deeper in the skin. DIAGNOSIS  Your caregiver can usually tell what is wrong by performing a physical exam. Skin or blood tests may also be done to determine the cause of your hives. In some cases, the cause cannot be determined. TREATMENT  Mild cases usually get better with medicines such as antihistamines. Severe cases may require an emergency epinephrine injection. If the cause of your hives is known, treatment includes avoiding that trigger.  HOME CARE INSTRUCTIONS   Avoid causes that trigger your hives.  Take antihistamines as directed by your caregiver to reduce the severity of your hives. Non-sedating or low-sedating antihistamines are usually recommended. Do not drive while taking an antihistamine.  Take any other medicines prescribed for itching as directed by your caregiver.  Wear loose-fitting clothing.  Keep all follow-up appointments as directed by your caregiver. SEEK MEDICAL CARE IF:   You have persistent or severe itching that is not relieved with medicine.  You have painful or swollen joints. SEEK IMMEDIATE MEDICAL CARE IF:   You have a fever.  Your tongue or lips are swollen.  You have  trouble breathing or swallowing.  You feel tightness in the throat or chest.  You have abdominal pain. These problems may be the first sign of a life-threatening allergic reaction. Call your local emergency services (911 in [place] MAKE SURE YOU:   Understand these instructions.  Will watch your condition.  Will get help right away if you are not doing well or get worse. Document Released: 09/29/2005 Document Revised: 10/04/2013 Document Reviewed: 12/23/2011 Exit[employer] Patient Information 2015 [employer]. This information is not intended to replace advice given to you by your health care provider. Make sure you discuss any questions you have with your health care provider.  

## 2014-05-07 NOTE — ED Provider Notes (Signed)
CSN: [id number]     Arrival date & time 05/07/14  0404 History   First MD Initiated Contact with Patient 05/07/14 0459     Chief Complaint  Patient presents with  . Allergic Reaction     (Consider location/radiation/quality/duration/timing/severity/associated sxs/prior Treatment) HPI Comments: Patient presents with 4 days of urticaria and itching.  To unknown, allergen.  She's been seen by this emergency department, and her primary care physician.  She has been on steroids, Benadryl, and Pepcid for several, days now.  She presents with 2 hours of throat swelling, feeling of shortness of breath, difficulty swallowing.  Tightness in the hands and feet.  Patient is a 28 y.o. female presenting with allergic reaction. The history is provided by the patient.  Allergic Reaction Presenting symptoms: difficulty breathing, itching, rash and swelling   Presenting symptoms: no difficulty swallowing and no wheezing   Difficulty breathing:    Severity:  Moderate   Onset quality:  Sudden   Timing:  Constant   Progression:  Unchanged Itching:    Location:  Full body   Severity:  Moderate   Onset quality:  Gradual   Duration:  4 days   Timing:  Intermittent   Progression:  Worsening Rash:    Location:  Full body   Quality: itchiness and redness     Severity:  Moderate   Onset quality:  Sudden   Timing:  Intermittent   Progression:  Waxing and waning Swelling:    Location:  Feet and hand   Onset quality:  Sudden   Duration:  3 hours   Timing:  Constant   Progression:  Worsening   Chronicity:  New Severity:  Moderate Prior allergic episodes:  No prior episodes Context: no dairy/milk products, no new detergents/soaps, no nuts and no poison ivy   Relieved by:  Nothing Worsened by:  Nothing tried Ineffective treatments:  Antihistamines and steroids   Past Medical History  Diagnosis Date  . Bronchitis    History reviewed. No pertinent past surgical history. No family history on  file. History  Substance Use Topics  . Smoking status: Former Games developer  . Smokeless tobacco: Not on file  . Alcohol Use: Yes     Comment: social   OB History   Grav Para Term Preterm Abortions TAB SAB Ect Mult Living                 Review of Systems  HENT: Negative for trouble swallowing.   Respiratory: Negative for wheezing.   Skin: Positive for itching and rash.  All other systems reviewed and are negative.     Allergies  Review of patient's allergies indicates no known allergies.  Home Medications   Prior to Admission medications   Medication Sig Start Date End Date Taking? Authorizing Provider  famotidine (PEPCID) 20 MG tablet Take 1 tablet (20 mg total) by mouth 2 (two) times daily. 05/05/14  Yes Jennifer L Piepenbrink, PA-C  tetrahydrozoline 0.05 % ophthalmic solution Place 1 drop into both eyes daily as needed. For eye irritation   Yes Historical Provider, MD  albuterol (PROVENTIL HFA;VENTOLIN HFA) 108 (90 BASE) MCG/ACT inhaler Inhale 1 puff into the lungs every 6 (six) hours as needed for wheezing or shortness of breath.    Historical Provider, MD  etonogestrel (IMPLANON) 68 MG IMPL implant Inject 1 each into the skin continuous.    Historical Provider, MD  LORazepam (ATIVAN) 1 MG tablet Take 1 tablet (1 mg total) by mouth 3 (three) times daily as  needed for anxiety. 05/07/14   Flint MelterElliott L Wentz, MD   BP 116/58  Pulse 99  Temp(Src) 97.7 F (36.5 C) (Oral)  Resp 20  Ht 5\' 7"  (1.702 m)  Wt 295 lb (133.811 kg)  BMI 46.19 kg/m2  SpO2 100%  LMP 04/04/2014 Physical Exam  ED Course  Procedures (including critical care time) Labs Review Labs Reviewed  COMPREHENSIVE METABOLIC PANEL - Abnormal; Notable for the following:    Sodium 136 (*)    Potassium 3.3 (*)    Albumin 3.3 (*)    All other components within normal limits  URINALYSIS, ROUTINE W REFLEX MICROSCOPIC - Abnormal; Notable for the following:    APPearance CLOUDY (*)    All other components within normal  limits  CBC WITH DIFFERENTIAL - Abnormal; Notable for the following:    WBC 16.0 (*)    Neutro Abs 11.8 (*)    Monocytes Absolute 1.3 (*)    All other components within normal limits    Imaging Review No results found.   EKG Interpretation None      MDM  Patient states she is no longer having difficulty swallowing, and she feels less tight in her chest, redness in is decreasing 5 disc admission.  The patient Final diagnoses:  Urticaria  Stress at work         Arman FilterGail K Ciela Mahajan, NP 05/07/14 2044

## 2014-09-29 ENCOUNTER — Encounter: Payer: Self-pay | Admitting: Obstetrics & Gynecology

## 2015-12-17 ENCOUNTER — Emergency Department (HOSPITAL_COMMUNITY)
Admission: EM | Admit: 2015-12-17 | Discharge: 2015-12-18 | Disposition: A | Discharge status: Home / Self Care | Payer: Self-pay | Attending: Emergency Medicine | Admitting: Emergency Medicine

## 2015-12-17 DIAGNOSIS — R112 Nausea with vomiting, unspecified: Secondary | ICD-10-CM

## 2015-12-17 DIAGNOSIS — R69 Illness, unspecified: Secondary | ICD-10-CM

## 2015-12-17 DIAGNOSIS — Y9289 Other specified places as the place of occurrence of the external cause: Secondary | ICD-10-CM | POA: Insufficient documentation

## 2015-12-17 DIAGNOSIS — J111 Influenza due to unidentified influenza virus with other respiratory manifestations: Secondary | ICD-10-CM | POA: Insufficient documentation

## 2015-12-17 DIAGNOSIS — Y998 Other external cause status: Secondary | ICD-10-CM | POA: Insufficient documentation

## 2015-12-17 DIAGNOSIS — Z3202 Encounter for pregnancy test, result negative: Secondary | ICD-10-CM | POA: Insufficient documentation

## 2015-12-17 DIAGNOSIS — Z87891 Personal history of nicotine dependence: Secondary | ICD-10-CM | POA: Insufficient documentation

## 2015-12-17 DIAGNOSIS — Z79899 Other long term (current) drug therapy: Secondary | ICD-10-CM | POA: Insufficient documentation

## 2015-12-17 DIAGNOSIS — S50862A Insect bite (nonvenomous) of left forearm, initial encounter: Secondary | ICD-10-CM | POA: Insufficient documentation

## 2015-12-17 DIAGNOSIS — Z7982 Long term (current) use of aspirin: Secondary | ICD-10-CM | POA: Insufficient documentation

## 2015-12-17 DIAGNOSIS — R410 Disorientation, unspecified: Secondary | ICD-10-CM | POA: Insufficient documentation

## 2015-12-17 DIAGNOSIS — W57XXXA Bitten or stung by nonvenomous insect and other nonvenomous arthropods, initial encounter: Secondary | ICD-10-CM | POA: Insufficient documentation

## 2015-12-17 DIAGNOSIS — Y9389 Activity, other specified: Secondary | ICD-10-CM | POA: Insufficient documentation

## 2015-12-17 LAB — CBC
HCT: 38.3 % (ref 36.0–46.0)
Hemoglobin: 12.5 g/dL (ref 12.0–15.0)
MCH: 27.6 pg (ref 26.0–34.0)
MCHC: 32.6 g/dL (ref 30.0–36.0)
MCV: 84.5 fL (ref 78.0–100.0)
Platelets: 338 10*3/uL (ref 150–400)
RBC: 4.53 MIL/uL (ref 3.87–5.11)
RDW: 13.5 % (ref 11.5–15.5)
WBC: 12.7 10*3/uL — ABNORMAL HIGH (ref 4.0–10.5)

## 2015-12-17 LAB — COMPREHENSIVE METABOLIC PANEL
ALT: 9 U/L — ABNORMAL LOW (ref 14–54)
ANION GAP: 10 (ref 5–15)
AST: 18 U/L (ref 15–41)
Albumin: 3.7 g/dL (ref 3.5–5.0)
Alkaline Phosphatase: 74 U/L (ref 38–126)
BUN: 11 mg/dL (ref 6–20)
CALCIUM: 8.9 mg/dL (ref 8.9–10.3)
CHLORIDE: 107 mmol/L (ref 101–111)
CO2: 21 mmol/L — AB (ref 22–32)
Creatinine, Ser: 0.73 mg/dL (ref 0.44–1.00)
GFR calc Af Amer: >60 mL/min (ref >60)
GFR calc non Af Amer: >60 mL/min (ref >60)
Glucose, Bld: 96 mg/dL (ref 65–99)
Potassium: 3.6 mmol/L (ref 3.5–5.1)
SODIUM: 138 mmol/L (ref 135–145)
Total Bilirubin: <0.1 mg/dL — ABNORMAL LOW (ref 0.3–1.2)
Total Protein: 7.9 g/dL (ref 6.5–8.1)

## 2015-12-17 LAB — URINE MICROSCOPIC-ADD ON
Bacteria, UA: NONE SEEN
WBC UA: NONE SEEN WBC/hpf (ref 0–5)

## 2015-12-17 LAB — I-STAT BETA HCG BLOOD, ED (MC, WL, AP ONLY)

## 2015-12-17 LAB — URINALYSIS, ROUTINE W REFLEX MICROSCOPIC
Bilirubin Urine: NEGATIVE
Glucose, UA: NEGATIVE mg/dL
Ketones, ur: NEGATIVE mg/dL
Leukocytes, UA: NEGATIVE
Nitrite: NEGATIVE
Protein, ur: NEGATIVE mg/dL
SPECIFIC GRAVITY, URINE: 1.023 (ref 1.005–1.030)
pH: 6 (ref 5.0–8.0)

## 2015-12-17 LAB — LIPASE, BLOOD: LIPASE: 20 U/L (ref 11–51)

## 2015-12-17 MED ORDER — DIPHENHYDRAMINE HCL 50 MG/ML IJ SOLN
25.0000 mg | Freq: Once | INTRAMUSCULAR | Status: AC
  Administered 2015-12-18: 25 mg via INTRAVENOUS
  Filled 2015-12-17: qty 1

## 2015-12-17 MED ORDER — ONDANSETRON HCL 4 MG/2ML IJ SOLN
4.0000 mg | Freq: Once | INTRAMUSCULAR | Status: AC
  Administered 2015-12-18: 4 mg via INTRAVENOUS
  Filled 2015-12-17: qty 2

## 2015-12-17 MED ORDER — SODIUM CHLORIDE 0.9 % IV BOLUS (SEPSIS)
1000.0000 mL | Freq: Once | INTRAVENOUS | Status: AC
  Administered 2015-12-18: 1000 mL via INTRAVENOUS

## 2015-12-17 NOTE — ED Provider Notes (Signed)
CSN: [id number]     Arrival date & time 12/17/15  2054 History  By signing my name below, I, Tanda Rockers, attest that this documentation has been prepared under the direction and in the presence of Gerhard Munch, MD. Electronically Signed: Tanda Rockers, ED Scribe. 12/17/2015. 11:56 PM.   Chief Complaint  Patient presents with  . Abdominal Pain   The history is provided by the patient. No language interpreter was used.     HPI Comments: Allison Gutierrez is a 30 y.o. female who presents to the Emergency Department complaining of gradual onset, constant, nausea x 4 days. Pt reports that today she began vomiting and had 2 episodes of sharp pain to her LLQ. She also complains of generalized weakness, headache, disorientation, and a sensitivity to her skin. She has had recent sick contact with similar symptoms. Pt was also bitten by something on her left forearm today which caused swelling to the area. Denies fever, syncope, chest pain, diarrhea, hematochezia, hematemesis, or any other associated symptoms. Recent sick contact with similar symptoms.   Past Medical History  Diagnosis Date  . Bronchitis    No past surgical history on file. No family history on file. Social History  Substance Use Topics  . Smoking status: Former Games developer  . Smokeless tobacco: Not on file  . Alcohol Use: Yes     Comment: social   OB History    No data available     Review of Systems  Constitutional: Negative for fever.       Per HPI, otherwise negative  HENT:       Per HPI, otherwise negative  Respiratory:       Per HPI, otherwise negative  Cardiovascular: Negative for chest pain.       Per HPI, otherwise negative  Gastrointestinal: Positive for nausea, vomiting and abdominal pain. Negative for diarrhea and blood in stool.  Endocrine:       Negative aside from HPI  Genitourinary:       Neg aside from HPI   Musculoskeletal:       Per HPI, otherwise negative  Skin: Negative.        + Bug bite to  left forearm  Neurological: Positive for weakness (generalized) and headaches. Negative for syncope.  Psychiatric/Behavioral: Positive for confusion.   Allergies  Prednisone  Home Medications   Prior to Admission medications   Medication Sig Start Date End Date Taking? Authorizing Provider  albuterol (PROVENTIL HFA;VENTOLIN HFA) 108 (90 BASE) MCG/ACT inhaler Inhale 1 puff into the lungs every 6 (six) hours as needed for wheezing or shortness of breath.   Yes Historical Provider, MD  aspirin 500 MG EC tablet Take 500 mg by mouth every 6 (six) hours as needed for pain.   Yes Historical Provider, MD  etonogestrel (IMPLANON) 68 MG IMPL implant Inject 1 each into the skin continuous.   Yes Historical Provider, MD  LORazepam (ATIVAN) 1 MG tablet Take 1 tablet (1 mg total) by mouth 3 (three) times daily as needed for anxiety. 05/07/14  Yes Mancel Bale, MD  Vitamin D, Ergocalciferol, (DRISDOL) 50000 units CAPS capsule Take 50,000 Units by mouth every 7 (seven) days.   Yes Historical Provider, MD  famotidine (PEPCID) 20 MG tablet Take 1 tablet (20 mg total) by mouth 2 (two) times daily. Patient not taking: Reported on 12/17/2015 05/05/14   Francee Piccolo, PA-C   BP 158/124 mmHg  Pulse 105  Temp(Src) 98.1 F (36.7 C) (Oral)  Resp 18  SpO2 100%  LMP 11/13/2015 (Within Days)   Physical Exam  Constitutional: She is oriented to person, place, and time. She appears well-developed and well-nourished. No distress.  HENT:  Head: Normocephalic and atraumatic.  Eyes: Conjunctivae are normal. Right eye exhibits no discharge. Left eye exhibits no discharge.  Cardiovascular: Normal rate, regular rhythm and normal heart sounds.   Pulmonary/Chest: Effort normal and breath sounds normal. No respiratory distress. She has no wheezes. She has no rales.  Abdominal: Soft. There is no tenderness.  Musculoskeletal: She exhibits no edema.  Neurological: She is alert and oriented to person, place, and time. No  cranial nerve deficit.  Skin: She is not diaphoretic.  Faint erythematous 5 cm diameter circle on the left medial forearm.   Psychiatric: She has a normal mood and affect.    ED Course  Procedures (including critical care time)  DIAGNOSTIC STUDIES: Oxygen Saturation is 100% on RA, normal by my interpretation.    COORDINATION OF CARE: 11:55 PM-Discussed treatment plan with pt at bedside and pt agreed to plan.   Labs Review Labs Reviewed  COMPREHENSIVE METABOLIC PANEL - Abnormal; Notable for the following:    CO2 21 (*)    ALT 9 (*)    Total Bilirubin <0.1 (*)    All other components within normal limits  CBC - Abnormal; Notable for the following:    WBC 12.7 (*)    All other components within normal limits  URINALYSIS, ROUTINE W REFLEX MICROSCOPIC (NOT [facility]) - Abnormal; Notable for the following:    Hgb urine dipstick SMALL (*)    All other components within normal limits  URINE MICROSCOPIC-ADD ON - Abnormal; Notable for the following:    Squamous Epithelial / LPF 0-5 (*)    All other components within normal limits  LIPASE, BLOOD  I-STAT BETA HCG BLOOD, ED ([facility], [facility], [facility] ONLY)   1:33 AM On repeat exam the patient appears calm, in no distress. She acknowledges understanding of all findings, need for rest, rehydration, return precautions, follow-up instructions.  MDM   I personally performed the services described in this documentation, which was scribed in my presence. The recorded information has been reviewed and is accurate.    Patient presents with several days of ongoing nausea, and after a presumed insect bite that occurred earlier today. Here, the patient is awake, alert, with soft, non-peritoneal abdomen. Patient has mild leukocytosis, but no evidence for bacteremia, sepsis, peritonitis. Patient is on the cusp of time for her menstrual cycle, has no ongoing dysuria, no sustained flank pain consistent with kidney stone. Patient precipitously with fluids,  antiemetics. Patient discharged in stable condition with return precautions, follow-up instructions.  Gerhard Munchobert Fleetwood Pierron, MD 12/18/15 [phone]

## 2015-12-17 NOTE — ED Notes (Signed)
Pt states that she has had abdominal pain and N/V today and was also bitten by something on her L arm today. Alert and oriented.

## 2015-12-18 MED ORDER — ONDANSETRON 4 MG PO TBDP: 4.0000 mg | ORAL_TABLET | Freq: Three times a day (TID) | ORAL | Status: DC | PRN

## 2015-12-18 NOTE — Discharge Instructions (Signed)
As discussed, your evaluation today has been largely reassuring.  But, it is important that you monitor your condition carefully, and do not hesitate to return to the ED if you develop new, or concerning changes in your condition. ? ?Otherwise, please follow-up with your physician for appropriate ongoing care. ? ?

## 2016-04-16 ENCOUNTER — Other Ambulatory Visit (HOSPITAL_COMMUNITY): Payer: Self-pay | Admitting: Family

## 2016-05-01 ENCOUNTER — Ambulatory Visit (INDEPENDENT_AMBULATORY_CARE_PROVIDER_SITE_OTHER): Payer: Self-pay | Admitting: Obstetrics and Gynecology

## 2016-05-01 ENCOUNTER — Encounter: Payer: Self-pay | Admitting: Obstetrics and Gynecology

## 2016-05-01 VITALS — BP 121/86 | HR 96 | Ht 67.0 in | Wt 314.0 lb

## 2016-05-01 DIAGNOSIS — Z3046 Encounter for surveillance of implantable subdermal contraceptive: Secondary | ICD-10-CM

## 2016-05-01 NOTE — Procedures (Signed)
Nexplanon Procedure Note  Pre-operative Diagnosis: Nexplanon in place  Post-operative Diagnosis: Nexplanon removed  Indications: Expired and patient desires another form of contraception  Procedure Details  The risks (including infection, bleeding, pain, etc) and benefits of the procedure were explained to the patient and Written informed consent was obtained.  The Nexplanon was located in the LUE with the medial end just underneath an old scar.  The area was swabbed with alcohol and then the 3mL of lidocaine with epinephrine was infiltrated just underneath the implant. The area was then prepped with betadine. Sterile gloves and instruments were used to make a stab incision with the implant brought to the incision and the capsule dissected off and the implant grasped and removed with a hemostat; it was noted to be intact. This was done with moderate difficulty and the initial incision had a slight "T" made to help deliver the implant to the surface.The area was the prepped and swabbed with betadine and a 3-0 vicryl suture used to approximate the incision using subcuticular technique.  A small subcum defect was closed with steri strips and a 2x2 gauze and bandaid were then applied.  At the conclusion, the patient had no pain or change in range of motion of her arm/hand  Condition: Stable  Complications: None  Plan: The patient was advised to call for any fever or for prolonged or severe pain or bleeding. She was advised to use OTC analgesics as needed for mild to moderate pain.   She already has a script for the patch, which she used before and was told she can apply it today but to consider effective in 1wk.   Cornelia Copaharlie Yunique Dearcos, Jr MD Attending Center for [facility](Faculty Practice)

## 2016-05-09 ENCOUNTER — Ambulatory Visit: Payer: BLUE CROSS/BLUE SHIELD | Admitting: Obstetrics & Gynecology

## 2016-09-22 ENCOUNTER — Ambulatory Visit (HOSPITAL_COMMUNITY)
Admission: EM | Admit: 2016-09-22 | Discharge: 2016-09-22 | Disposition: A | Discharge status: Home / Self Care | Payer: Self-pay | Attending: Family Medicine | Admitting: Family Medicine

## 2016-09-22 ENCOUNTER — Encounter (HOSPITAL_COMMUNITY): Payer: Self-pay | Admitting: Emergency Medicine

## 2016-09-22 DIAGNOSIS — R05 Cough: Secondary | ICD-10-CM

## 2016-09-22 DIAGNOSIS — R059 Cough, unspecified: Secondary | ICD-10-CM

## 2016-09-22 DIAGNOSIS — J208 Acute bronchitis due to other specified organisms: Secondary | ICD-10-CM

## 2016-09-22 MED ORDER — IPRATROPIUM BROMIDE 0.06 % NA SOLN
2.0000 | Freq: Four times a day (QID) | NASAL | 0 refills | Status: DC
Start: 2016-09-22 — End: 2019-09-12

## 2016-09-22 MED ORDER — FLUCONAZOLE 150 MG PO TABS: 150.0000 mg | ORAL_TABLET | Freq: Every day | ORAL | 0 refills | Status: DC

## 2016-09-22 MED ORDER — BENZONATATE 100 MG PO CAPS: 200.0000 mg | ORAL_CAPSULE | Freq: Three times a day (TID) | ORAL | 0 refills | Status: DC | PRN

## 2016-09-22 MED ORDER — AZITHROMYCIN 250 MG PO TABS: 250.0000 mg | ORAL_TABLET | Freq: Every day | ORAL | 0 refills | Status: DC

## 2016-09-22 NOTE — ED Triage Notes (Signed)
The [facility] with a complaint of a productive cough with congestion x 1 week. The patient reported using OTC mucinex.

## 2016-09-22 NOTE — ED Provider Notes (Signed)
CSN: [id number]     Arrival date & time 09/22/16  1746 History   First MD Initiated Contact with Patient 09/22/16 1834     Chief Complaint  Patient presents with  . Cough   (Consider location/radiation/quality/duration/timing/severity/associated sxs/prior Treatment) Patient is c/o cough and congestion for a week.  SHe has been using some mucinex   The history is provided by the patient.  Cough  Cough characteristics:  Productive Sputum characteristics:  Nondescript Severity:  Moderate Onset quality:  Sudden Duration:  1 week Timing:  Intermittent Chronicity:  New Smoker: no   Context: upper respiratory infection and weather changes   Relieved by:  Nothing Worsened by:  Nothing Ineffective treatments:  None tried   Past Medical History:  Diagnosis Date  . Bronchitis    History reviewed. No pertinent surgical history. History reviewed. No pertinent family history. Social History  Substance Use Topics  . Smoking status: Former Games developermoker  . Smokeless tobacco: Not on file  . Alcohol use Yes     Comment: social   OB History    Gravida Para Term Preterm AB Living   2 0 0 0 2 0   SAB TAB Ectopic Multiple Live Births   0 2 0 0       Review of Systems  Constitutional: Negative.   HENT: Negative.   Eyes: Negative.   Respiratory: Positive for cough.   Cardiovascular: Negative.   Gastrointestinal: Negative.   Endocrine: Negative.   Genitourinary: Negative.   Musculoskeletal: Negative.   Skin: Negative.   Allergic/Immunologic: Negative.   Neurological: Negative.   Hematological: Negative.   Psychiatric/Behavioral: Negative.     Allergies  Prednisone  Home Medications   Prior to Admission medications   Medication Sig Start Date End Date Taking? Authorizing Provider  albuterol (PROVENTIL HFA;VENTOLIN HFA) 108 (90 BASE) MCG/ACT inhaler Inhale 1 puff into the lungs every 6 (six) hours as needed for wheezing or shortness of breath. Reported on 05/01/2016    Historical  Provider, MD  aspirin 500 MG EC tablet Take 500 mg by mouth every 6 (six) hours as needed for pain.    Historical Provider, MD  azithromycin (ZITHROMAX) 250 MG tablet Take 1 tablet (250 mg total) by mouth daily. Take first 2 tablets together, then 1 every day until finished. 09/22/16   Deatra CanterWilliam J Oxford, FNP  benzonatate (TESSALON) 100 MG capsule Take 2 capsules (200 mg total) by mouth 3 (three) times daily as needed for cough. 09/22/16   Deatra CanterWilliam J Oxford, FNP  etonogestrel (IMPLANON) 68 MG IMPL implant Inject 1 each into the skin continuous.    Historical Provider, MD  famotidine (PEPCID) 20 MG tablet Take 1 tablet (20 mg total) by mouth 2 (two) times daily. 05/05/14   Jennifer Piepenbrink, PA-C  fluconazole (DIFLUCAN) 150 MG tablet Take 1 tablet (150 mg total) by mouth daily. 09/22/16   Deatra CanterWilliam J Oxford, FNP  ipratropium (ATROVENT) 0.06 % nasal spray Place 2 sprays into both nostrils 4 (four) times daily. 09/22/16   Deatra CanterWilliam J Oxford, FNP  ondansetron (ZOFRAN ODT) 4 MG disintegrating tablet Take 1 tablet (4 mg total) by mouth every 8 (eight) hours as needed for nausea or vomiting. 12/18/15   Gerhard Munchobert Lockwood, MD  Vitamin D, Ergocalciferol, (DRISDOL) 50000 units CAPS capsule Take 50,000 Units by mouth every 7 (seven) days.    Historical Provider, MD   Meds Ordered and Administered this Visit  Medications - No data to display  BP 137/86 (BP Location: Left Wrist)  Pulse 89   Temp 97.8 F (36.6 C) (Oral)   Resp 20   LMP 09/21/2016 (Exact Date)   SpO2 100%  No data found.   Physical Exam  Constitutional: She is oriented to person, place, and time. She appears well-developed and well-nourished.  HENT:  Head: Normocephalic and atraumatic.  Right Ear: External ear normal.  Left Ear: External ear normal.  Mouth/Throat: Oropharynx is clear and moist.  Eyes: Conjunctivae and EOM are normal. Pupils are equal, round, and reactive to light.  Neck: Normal range of motion. Neck supple.  Cardiovascular:  Normal rate, regular rhythm and normal heart sounds.   Pulmonary/Chest: Effort normal and breath sounds normal.  Abdominal: Soft.  Neurological: She is alert and oriented to person, place, and time.  Nursing note and vitals reviewed.   Urgent Care Course   Clinical Course     Procedures (including critical care time)  Labs Review Labs Reviewed - No data to display  Imaging Review No results found.   Visual Acuity Review  Right Eye Distance:   Left Eye Distance:   Bilateral Distance:    Right Eye Near:   Left Eye Near:    Bilateral Near:         MDM   1. Acute bronchitis due to other specified organisms   2. Cough    Zpak Tessalon Perles Ipratropium Nasal spray 0.06% 2 sprays qid prn #6215ml Diflucan 150mg  one po qd #2  Push po fluids, rest, tylenol and motrin otc prn as directed for fever, arthralgias, and myalgias.  Follow up prn if sx's continue or persist.    Deatra CanterWilliam J Oxford, FNP 09/22/16 [phone]

## 2018-01-26 DIAGNOSIS — G44209 Tension-type headache, unspecified, not intractable: Secondary | ICD-10-CM | POA: Diagnosis not present

## 2018-07-13 DIAGNOSIS — M545 Low back pain: Secondary | ICD-10-CM | POA: Diagnosis not present

## 2018-07-13 DIAGNOSIS — N925 Other specified irregular menstruation: Secondary | ICD-10-CM | POA: Diagnosis not present

## 2018-09-30 DIAGNOSIS — Z3040 Encounter for surveillance of contraceptives, unspecified: Secondary | ICD-10-CM | POA: Diagnosis not present

## 2018-09-30 DIAGNOSIS — Z6841 Body Mass Index (BMI) 40.0 and over, adult: Secondary | ICD-10-CM | POA: Diagnosis not present

## 2018-09-30 DIAGNOSIS — Z113 Encounter for screening for infections with a predominantly sexual mode of transmission: Secondary | ICD-10-CM | POA: Diagnosis not present

## 2018-09-30 DIAGNOSIS — Z01419 Encounter for gynecological examination (general) (routine) without abnormal findings: Secondary | ICD-10-CM | POA: Diagnosis not present

## 2018-10-08 DIAGNOSIS — G5601 Carpal tunnel syndrome, right upper limb: Secondary | ICD-10-CM | POA: Diagnosis not present

## 2018-10-08 DIAGNOSIS — R202 Paresthesia of skin: Secondary | ICD-10-CM | POA: Diagnosis not present

## 2018-10-09 ENCOUNTER — Encounter (HOSPITAL_COMMUNITY): Payer: Self-pay

## 2018-10-09 ENCOUNTER — Ambulatory Visit (HOSPITAL_COMMUNITY)
Admission: EM | Admit: 2018-10-09 | Discharge: 2018-10-09 | Disposition: A | Discharge status: Home / Self Care | Payer: BLUE CROSS/BLUE SHIELD | Attending: Family Medicine | Admitting: Family Medicine

## 2018-10-09 ENCOUNTER — Other Ambulatory Visit: Payer: Self-pay

## 2018-10-09 DIAGNOSIS — R059 Cough, unspecified: Secondary | ICD-10-CM

## 2018-10-09 DIAGNOSIS — R05 Cough: Secondary | ICD-10-CM | POA: Insufficient documentation

## 2018-10-09 DIAGNOSIS — J111 Influenza due to unidentified influenza virus with other respiratory manifestations: Secondary | ICD-10-CM

## 2018-10-09 DIAGNOSIS — R062 Wheezing: Secondary | ICD-10-CM | POA: Insufficient documentation

## 2018-10-09 DIAGNOSIS — R69 Illness, unspecified: Secondary | ICD-10-CM | POA: Insufficient documentation

## 2018-10-09 DIAGNOSIS — J101 Influenza due to other identified influenza virus with other respiratory manifestations: Secondary | ICD-10-CM | POA: Diagnosis not present

## 2018-10-09 MED ORDER — ACETAMINOPHEN 325 MG PO TABS
650.0000 mg | ORAL_TABLET | Freq: Once | ORAL | Status: AC
  Administered 2018-10-09: 650 mg via ORAL

## 2018-10-09 MED ORDER — ACETAMINOPHEN 325 MG PO TABS
ORAL_TABLET | ORAL | Status: AC
  Filled 2018-10-09: qty 2

## 2018-10-09 MED ORDER — HYDROCODONE-HOMATROPINE 5-1.5 MG/5ML PO SYRP: 5.0000 mL | ORAL_SOLUTION | Freq: Four times a day (QID) | ORAL | 0 refills | Status: DC | PRN

## 2018-10-09 MED ORDER — ALBUTEROL SULFATE HFA 108 (90 BASE) MCG/ACT IN AERS: 2.0000 | INHALATION_SPRAY | RESPIRATORY_TRACT | 1 refills | Status: AC | PRN

## 2018-10-09 NOTE — ED Triage Notes (Addendum)
Pt cc coughing and wheezing and SOB. Pt states the cough is tight and a fever. Pt has been nauseous.

## 2018-10-09 NOTE — Discharge Instructions (Signed)

## 2018-10-09 NOTE — ED Provider Notes (Signed)
[facility]   [id number] 10/09/18 Arrival Time: 1131  ASSESSMENT & PLAN:  1. Cough   2. Wheezing   3. Influenza-like illness    See AVS for D/C instructions.  Meds ordered this encounter  Medications  . acetaminophen (TYLENOL) tablet 650 mg  . HYDROcodone-homatropine (HYCODAN) 5-1.5 MG/5ML syrup    Sig: Take 5 mLs by mouth every 6 (six) hours as needed for cough.    Dispense:  90 mL    Refill:  0  . albuterol (PROVENTIL HFA;VENTOLIN HFA) 108 (90 Base) MCG/ACT inhaler    Sig: Inhale 2 puffs into the lungs every 4 (four) hours as needed for wheezing or shortness of breath.    Dispense:  1 Inhaler    Refill:  1   Declines prednisone secondary to "allergy". Cough medication sedation precautions. Discussed typical duration of symptoms. OTC symptom care as needed. Ensure adequate fluid intake and rest. May f/u with PCP or here as needed.  Reviewed expectations re: course of current medical issues. Questions answered. Outlined signs and symptoms indicating need for more acute intervention. Patient verbalized understanding. After Visit Summary given.   SUBJECTIVE: History from: patient.  Allison Gutierrez is a 32 y.o. female who presents with complaint of nasal congestion, post-nasal drainage, and a persistent dry cough; without sore throat. Onset abrupt, over the past 2 days. Overall with fatigue and with body aches. SOB: none. Wheezing: mild and sporadic. No back pain. Ambulatory without difficulty. Fever: yes, subjective with chills. Overall normal PO intake mild nausea; no emesis. Sick contacts: no. No specific or significant aggravating or alleviating factors reported. OTC treatment: none reported.  Received flu shot this year: no.  Social History   Tobacco Use  Smoking Status Former Smoker  Smokeless Tobacco Never Used    ROS: As per HPI. All other systems negative.    OBJECTIVE:  Vitals:   10/09/18 1248 10/09/18 1250  BP:  137/80  Pulse:  (!) 117    Resp:  20  Temp:  (!) 101.4 F (38.6 C)  TempSrc:  Oral  SpO2:  100%  Weight: (!) 147.4 kg     Temperature and tachycardia noted.  General appearance: alert; appears fatigued HEENT: nasal congestion; clear runny nose; throat irritation secondary to post-nasal drainage Neck: supple without LAD CV: regular rhythm; no murmers Lungs: unlabored respirations, symmetrical air entry with moderate bilateral expiratory wheezing; cough: moderate; able to speak in full sentences Abd: obese; soft; nontender Ext: no LE edema Skin: warm and dry Psychological: alert and cooperative; normal mood and affect   Allergies  Allergen Reactions  . Prednisone     Made hives worse.     Past Medical History:  Diagnosis Date  . Bronchitis     Social History   Socioeconomic History  . Marital status: Single    Spouse name: Not on file  . Number of children: Not on file  . Years of education: Not on file  . Highest education level: Not on file  Occupational History  . Not on file  Social Needs  . Financial resource strain: Not on file  . Food insecurity:    Worry: Not on file    Inability: Not on file  . Transportation needs:    Medical: Not on file    Non-medical: Not on file  Tobacco Use  . Smoking status: Former Games developermoker  . Smokeless tobacco: Never Used  Substance and Sexual Activity  . Alcohol use: Yes    Comment: social  .  Drug use: No  . Sexual activity: Not on file  Lifestyle  . Physical activity:    Days per week: Not on file    Minutes per session: Not on file  . Stress: Not on file  Relationships  . Social connections:    Talks on phone: Not on file    Gets together: Not on file    Attends religious service: Not on file    Active member of club or organization: Not on file    Attends meetings of clubs or organizations: Not on file    Relationship status: Not on file  . Intimate partner violence:    Fear of current or ex partner: Not on file    Emotionally abused:  Not on file    Physically abused: Not on file    Forced sexual activity: Not on file  Other Topics Concern  . Not on file  Social History Narrative  . Not on file           Mardella LaymanHagler, Mayme Profeta, MD 10/09/18 1319

## 2019-03-21 DIAGNOSIS — F4323 Adjustment disorder with mixed anxiety and depressed mood: Secondary | ICD-10-CM | POA: Diagnosis not present

## 2019-03-28 DIAGNOSIS — F4323 Adjustment disorder with mixed anxiety and depressed mood: Secondary | ICD-10-CM | POA: Diagnosis not present

## 2019-04-07 DIAGNOSIS — F4323 Adjustment disorder with mixed anxiety and depressed mood: Secondary | ICD-10-CM | POA: Diagnosis not present

## 2019-04-19 DIAGNOSIS — F4323 Adjustment disorder with mixed anxiety and depressed mood: Secondary | ICD-10-CM | POA: Diagnosis not present

## 2019-05-03 DIAGNOSIS — F4323 Adjustment disorder with mixed anxiety and depressed mood: Secondary | ICD-10-CM | POA: Diagnosis not present

## 2019-05-19 DIAGNOSIS — F4323 Adjustment disorder with mixed anxiety and depressed mood: Secondary | ICD-10-CM | POA: Diagnosis not present

## 2019-05-24 DIAGNOSIS — J309 Allergic rhinitis, unspecified: Secondary | ICD-10-CM | POA: Diagnosis not present

## 2019-05-24 DIAGNOSIS — F4323 Adjustment disorder with mixed anxiety and depressed mood: Secondary | ICD-10-CM | POA: Diagnosis not present

## 2019-05-24 DIAGNOSIS — J45909 Unspecified asthma, uncomplicated: Secondary | ICD-10-CM | POA: Diagnosis not present

## 2019-05-24 DIAGNOSIS — L709 Acne, unspecified: Secondary | ICD-10-CM | POA: Diagnosis not present

## 2019-06-01 DIAGNOSIS — F4323 Adjustment disorder with mixed anxiety and depressed mood: Secondary | ICD-10-CM | POA: Diagnosis not present

## 2019-06-08 DIAGNOSIS — J309 Allergic rhinitis, unspecified: Secondary | ICD-10-CM | POA: Diagnosis not present

## 2019-06-08 DIAGNOSIS — F4323 Adjustment disorder with mixed anxiety and depressed mood: Secondary | ICD-10-CM | POA: Diagnosis not present

## 2019-06-16 DIAGNOSIS — F4323 Adjustment disorder with mixed anxiety and depressed mood: Secondary | ICD-10-CM | POA: Diagnosis not present

## 2019-06-28 DIAGNOSIS — F4323 Adjustment disorder with mixed anxiety and depressed mood: Secondary | ICD-10-CM | POA: Diagnosis not present

## 2019-06-29 DIAGNOSIS — J45909 Unspecified asthma, uncomplicated: Secondary | ICD-10-CM | POA: Diagnosis not present

## 2019-07-05 DIAGNOSIS — F4323 Adjustment disorder with mixed anxiety and depressed mood: Secondary | ICD-10-CM | POA: Diagnosis not present

## 2019-07-21 DIAGNOSIS — F4323 Adjustment disorder with mixed anxiety and depressed mood: Secondary | ICD-10-CM | POA: Diagnosis not present

## 2019-08-01 DIAGNOSIS — F4323 Adjustment disorder with mixed anxiety and depressed mood: Secondary | ICD-10-CM | POA: Diagnosis not present

## 2019-08-09 DIAGNOSIS — F4323 Adjustment disorder with mixed anxiety and depressed mood: Secondary | ICD-10-CM | POA: Diagnosis not present

## 2019-08-16 DIAGNOSIS — F4323 Adjustment disorder with mixed anxiety and depressed mood: Secondary | ICD-10-CM | POA: Diagnosis not present

## 2019-08-23 DIAGNOSIS — M25511 Pain in right shoulder: Secondary | ICD-10-CM | POA: Diagnosis not present

## 2019-08-23 DIAGNOSIS — M79673 Pain in unspecified foot: Secondary | ICD-10-CM | POA: Diagnosis not present

## 2019-08-23 DIAGNOSIS — M25561 Pain in right knee: Secondary | ICD-10-CM | POA: Diagnosis not present

## 2019-08-23 DIAGNOSIS — M25562 Pain in left knee: Secondary | ICD-10-CM | POA: Diagnosis not present

## 2019-08-23 DIAGNOSIS — F4323 Adjustment disorder with mixed anxiety and depressed mood: Secondary | ICD-10-CM | POA: Diagnosis not present

## 2019-08-23 DIAGNOSIS — M79671 Pain in right foot: Secondary | ICD-10-CM | POA: Diagnosis not present

## 2019-08-30 DIAGNOSIS — F4323 Adjustment disorder with mixed anxiety and depressed mood: Secondary | ICD-10-CM | POA: Diagnosis not present

## 2019-09-06 DIAGNOSIS — F4323 Adjustment disorder with mixed anxiety and depressed mood: Secondary | ICD-10-CM | POA: Diagnosis not present

## 2019-09-12 ENCOUNTER — Ambulatory Visit (HOSPITAL_COMMUNITY)
Admission: EM | Admit: 2019-09-12 | Discharge: 2019-09-12 | Disposition: A | Discharge status: Home / Self Care | Payer: BC Managed Care – PPO | Attending: Family Medicine | Admitting: Family Medicine

## 2019-09-12 ENCOUNTER — Other Ambulatory Visit: Payer: Self-pay

## 2019-09-12 ENCOUNTER — Encounter (HOSPITAL_COMMUNITY): Payer: Self-pay

## 2019-09-12 DIAGNOSIS — Z91018 Allergy to other foods: Secondary | ICD-10-CM | POA: Insufficient documentation

## 2019-09-12 DIAGNOSIS — Z888 Allergy status to other drugs, medicaments and biological substances status: Secondary | ICD-10-CM | POA: Insufficient documentation

## 2019-09-12 DIAGNOSIS — M791 Myalgia, unspecified site: Secondary | ICD-10-CM

## 2019-09-12 DIAGNOSIS — Z791 Long term (current) use of non-steroidal anti-inflammatories (NSAID): Secondary | ICD-10-CM | POA: Diagnosis not present

## 2019-09-12 DIAGNOSIS — Z79899 Other long term (current) drug therapy: Secondary | ICD-10-CM | POA: Insufficient documentation

## 2019-09-12 DIAGNOSIS — J029 Acute pharyngitis, unspecified: Secondary | ICD-10-CM | POA: Diagnosis not present

## 2019-09-12 DIAGNOSIS — R0981 Nasal congestion: Secondary | ICD-10-CM

## 2019-09-12 DIAGNOSIS — Z91012 Allergy to eggs: Secondary | ICD-10-CM | POA: Insufficient documentation

## 2019-09-12 DIAGNOSIS — B349 Viral infection, unspecified: Secondary | ICD-10-CM | POA: Diagnosis not present

## 2019-09-12 DIAGNOSIS — R519 Headache, unspecified: Secondary | ICD-10-CM

## 2019-09-12 DIAGNOSIS — Z87891 Personal history of nicotine dependence: Secondary | ICD-10-CM | POA: Insufficient documentation

## 2019-09-12 DIAGNOSIS — Z20828 Contact with and (suspected) exposure to other viral communicable diseases: Secondary | ICD-10-CM | POA: Insufficient documentation

## 2019-09-12 LAB — POC SARS CORONAVIRUS 2 AG: SARS Coronavirus 2 Ag: NEGATIVE

## 2019-09-12 LAB — POC SARS CORONAVIRUS 2 AG -  ED: SARS Coronavirus 2 Ag: NEGATIVE

## 2019-09-12 NOTE — Discharge Instructions (Signed)
Your rapid Covid test here was negative.  We will send another test off for navigation. You can take the medication as needed over-the-counter for symptoms. This is most likely a viral upper respiratory infection. Follow up as needed for continued or worsening symptoms

## 2019-09-12 NOTE — ED Triage Notes (Signed)
Pt presents to the UC with sore throat, nasal congestion, headache and body aches x 1 day. Pt took Advil Cold and Sinus 1 hr ago.

## 2019-09-12 NOTE — ED Provider Notes (Signed)
[facility]    CSN: [id number] Arrival date & time: 09/12/19  1223      History   Chief Complaint Chief Complaint  Patient presents with   Appointment    1230   Sore Throat   Nasal Congestion   Headache   Generalized Body Aches    HPI Allison Gutierrez is a 33 y.o. female.   Patient is a 33 year old female that presents today with approximately 1 to 2 days of body aches, sore throat, nasal congestion, headache.  Symptoms of been constant, waxing waning.  She took some Advil Cold and Sinus about 1 hour ago with some relief.  Was around family for Thanksgiving and told that a niece had a cold.  No known Covid exposures.  Does have a history of allergies.  Took allergy medicine.  No fever, chills, ear pain, loss of taste or smell, diarrhea.  ROS per HPI      Past Medical History:  Diagnosis Date   Bronchitis     Patient Active Problem List   Diagnosis Date Noted   Impetigo 02/02/2014   Seasonal allergies 02/02/2014    History reviewed. No pertinent surgical history.  OB History    Gravida  2   Para  0   Term  0   Preterm  0   AB  2   Living  0     SAB  0   TAB  2   Ectopic  0   Multiple  0   Live Births               Home Medications    Prior to Admission medications   Medication Sig Start Date End Date Taking? Authorizing Provider  Ascorbic Acid (VITAMIN C) 1000 MG tablet Take 1,000 mg by mouth daily.   Yes [provider]  Pseudoephedrine-Ibuprofen (ADVIL COLD/SINUS PO) Take by mouth.   Yes [provider]  albuterol (PROVENTIL HFA;VENTOLIN HFA) 108 (90 Base) MCG/ACT inhaler Inhale 2 puffs into the lungs every 4 (four) hours as needed for wheezing or shortness of breath. 10/09/18   Mardella Layman, MD  etonogestrel (IMPLANON) 68 MG IMPL implant Inject 1 each into the skin continuous.    [provider]  Vitamin D, Ergocalciferol, (DRISDOL) 50000 units CAPS capsule Take 50,000 Units by mouth every  7 (seven) days.    [provider]  famotidine (PEPCID) 20 MG tablet Take 1 tablet (20 mg total) by mouth 2 (two) times daily. 05/05/14 09/12/19  Piepenbrink, Victorino Dike, PA-C  ipratropium (ATROVENT) 0.06 % nasal spray Place 2 sprays into both nostrils 4 (four) times daily. 09/22/16 09/12/19  Deatra Canter, FNP    Family History History reviewed. No pertinent family history.  Social History Social History   Tobacco Use   Smoking status: Former Smoker   Smokeless tobacco: Never Used  Substance Use Topics   Alcohol use: Yes    Comment: social   Drug use: No     Allergies   Eggs or egg-derived products, Prednisone, and Rice   Review of Systems Review of Systems   Physical Exam Triage Vital Signs ED Triage Vitals  Enc Vitals Group     BP 09/12/19 1258 126/90     Pulse Rate 09/12/19 1258 (!) 107     Resp 09/12/19 1258 17     Temp 09/12/19 1258 97.9 F (36.6 C)     Temp Source 09/12/19 1258 Oral     SpO2 09/12/19 1258 95 %  Weight --      Height --      Head Circumference --      Peak Flow --      Pain Score 09/12/19 1254 6     Pain Loc --      Pain Edu? --      Excl. in [place]? --    No data found.  Updated Vital Signs BP 126/90 (BP Location: Left Wrist)    Pulse (!) 107    Temp 97.9 F (36.6 C) (Oral)    Resp 17    LMP  (Within Weeks) Comment: 2 weeks    SpO2 95%   Visual Acuity Right Eye Distance:   Left Eye Distance:   Bilateral Distance:    Right Eye Near:   Left Eye Near:    Bilateral Near:     Physical Exam Vitals signs and nursing note reviewed.  Constitutional:      General: She is not in acute distress.    Appearance: Normal appearance. She is well-developed. She is not ill-appearing, toxic-appearing or diaphoretic.  HENT:     Head: Normocephalic and atraumatic.     Right Ear: Tympanic membrane and ear canal normal.     Left Ear: Tympanic membrane and ear canal normal.  Eyes:     Conjunctiva/sclera: Conjunctivae normal.    Neck:     Musculoskeletal: Neck supple.  Cardiovascular:     Rate and Rhythm: Normal rate and regular rhythm.     Heart sounds: No murmur.  Pulmonary:     Effort: Pulmonary effort is normal. No respiratory distress.     Breath sounds: Normal breath sounds.  Musculoskeletal: Normal range of motion.  Skin:    General: Skin is warm and dry.  Neurological:     Mental Status: She is alert.  Psychiatric:        Mood and Affect: Mood normal.      UC Treatments / Results  Labs (all labs ordered are listed, but only abnormal results are displayed) Labs Reviewed  NOVEL CORONAVIRUS, NAA (HOSP ORDER, SEND-OUT TO REF LAB; TAT 18-24 HRS)  POC SARS CORONAVIRUS 2 AG -  ED  POC SARS CORONAVIRUS 2 AG    EKG   Radiology No results found.  Procedures Procedures (including critical care time)  Medications Ordered in UC Medications - No data to display  Initial Impression / Assessment and Plan / UC Course  I have reviewed the triage vital signs and the nursing notes.  Pertinent labs & imaging results that were available during my care of the patient were reviewed by me and considered in my medical decision making (see chart for details).    Rapid Covid test negative.  Sending another test for confirmation. Most likely viral URI Recommended continue over-the-counter medicine as needed for symptoms. Work note given Follow up as needed for continued or worsening symptoms  Final Clinical Impressions(s) / UC Diagnoses   Final diagnoses:  Viral illness     Discharge Instructions     Your rapid Covid test here was negative.  We will send another test off for navigation. You can take the medication as needed over-the-counter for symptoms. This is most likely a viral upper respiratory infection. Follow up as needed for continued or worsening symptoms     ED Prescriptions    None     PDMP not reviewed this encounter.   Orvan July, NP 09/12/19 1417

## 2019-09-13 DIAGNOSIS — F4323 Adjustment disorder with mixed anxiety and depressed mood: Secondary | ICD-10-CM | POA: Diagnosis not present

## 2019-09-14 LAB — NOVEL CORONAVIRUS, NAA (HOSP ORDER, SEND-OUT TO REF LAB; TAT 18-24 HRS): SARS-CoV-2, NAA: NOT DETECTED

## 2019-09-20 DIAGNOSIS — F4323 Adjustment disorder with mixed anxiety and depressed mood: Secondary | ICD-10-CM | POA: Diagnosis not present

## 2019-09-21 DIAGNOSIS — F4323 Adjustment disorder with mixed anxiety and depressed mood: Secondary | ICD-10-CM | POA: Diagnosis not present

## 2019-09-27 DIAGNOSIS — F4323 Adjustment disorder with mixed anxiety and depressed mood: Secondary | ICD-10-CM | POA: Diagnosis not present

## 2019-10-04 DIAGNOSIS — F4323 Adjustment disorder with mixed anxiety and depressed mood: Secondary | ICD-10-CM | POA: Diagnosis not present

## 2019-10-11 DIAGNOSIS — F4323 Adjustment disorder with mixed anxiety and depressed mood: Secondary | ICD-10-CM | POA: Diagnosis not present

## 2019-10-18 DIAGNOSIS — F4323 Adjustment disorder with mixed anxiety and depressed mood: Secondary | ICD-10-CM | POA: Diagnosis not present

## 2019-11-01 DIAGNOSIS — F4323 Adjustment disorder with mixed anxiety and depressed mood: Secondary | ICD-10-CM | POA: Diagnosis not present

## 2019-11-15 DIAGNOSIS — F4323 Adjustment disorder with mixed anxiety and depressed mood: Secondary | ICD-10-CM | POA: Diagnosis not present

## 2019-11-29 DIAGNOSIS — Z0000 Encounter for general adult medical examination without abnormal findings: Secondary | ICD-10-CM | POA: Diagnosis not present

## 2019-11-29 DIAGNOSIS — R946 Abnormal results of thyroid function studies: Secondary | ICD-10-CM | POA: Diagnosis not present

## 2019-11-29 DIAGNOSIS — F4323 Adjustment disorder with mixed anxiety and depressed mood: Secondary | ICD-10-CM | POA: Diagnosis not present

## 2019-11-29 DIAGNOSIS — R7309 Other abnormal glucose: Secondary | ICD-10-CM | POA: Diagnosis not present

## 2019-12-06 DIAGNOSIS — Z0000 Encounter for general adult medical examination without abnormal findings: Secondary | ICD-10-CM | POA: Diagnosis not present

## 2019-12-06 DIAGNOSIS — J45909 Unspecified asthma, uncomplicated: Secondary | ICD-10-CM | POA: Diagnosis not present

## 2019-12-06 DIAGNOSIS — Z91018 Allergy to other foods: Secondary | ICD-10-CM | POA: Diagnosis not present

## 2019-12-13 DIAGNOSIS — F4323 Adjustment disorder with mixed anxiety and depressed mood: Secondary | ICD-10-CM | POA: Diagnosis not present

## 2019-12-21 DIAGNOSIS — M25561 Pain in right knee: Secondary | ICD-10-CM | POA: Diagnosis not present

## 2019-12-21 DIAGNOSIS — M25562 Pain in left knee: Secondary | ICD-10-CM | POA: Diagnosis not present

## 2019-12-27 DIAGNOSIS — F4323 Adjustment disorder with mixed anxiety and depressed mood: Secondary | ICD-10-CM | POA: Diagnosis not present

## 2019-12-27 DIAGNOSIS — F411 Generalized anxiety disorder: Secondary | ICD-10-CM | POA: Diagnosis not present

## 2020-01-14 ENCOUNTER — Ambulatory Visit: Payer: BC Managed Care – PPO | Attending: Family

## 2020-01-14 DIAGNOSIS — Z23 Encounter for immunization: Secondary | ICD-10-CM

## 2020-01-14 NOTE — Progress Notes (Signed)
   Covid-19 Vaccination Clinic  Name:  Allison Gutierrez    MRN: [id number] DOB: 1986/07/01  01/14/2020  Allison Gutierrez was observed post Covid-19 immunization for 15 minutes without incident. She was provided with Vaccine Information Sheet and instruction to access the V-Safe system.   Allison Gutierrez was instructed to call 911 with any severe reactions post vaccine: Marland Kitchen Difficulty breathing  . Swelling of face and throat  . A fast heartbeat  . A bad rash all over body  . Dizziness and weakness   Immunizations Administered    Name Date Dose VIS Date Route   JANSSEN COVID-19 VACCINE 01/14/2020 10:34 AM 0.5 mL 12/10/2019 Intramuscular   Manufacturer: [employer]   Lot: [id number]   NDC: 99833-825-05

## 2020-01-17 DIAGNOSIS — F411 Generalized anxiety disorder: Secondary | ICD-10-CM | POA: Diagnosis not present

## 2020-01-17 DIAGNOSIS — F4323 Adjustment disorder with mixed anxiety and depressed mood: Secondary | ICD-10-CM | POA: Diagnosis not present

## 2020-02-10 DIAGNOSIS — F900 Attention-deficit hyperactivity disorder, predominantly inattentive type: Secondary | ICD-10-CM | POA: Diagnosis not present

## 2020-02-10 DIAGNOSIS — F4323 Adjustment disorder with mixed anxiety and depressed mood: Secondary | ICD-10-CM | POA: Diagnosis not present

## 2020-02-10 DIAGNOSIS — F340 Cyclothymic disorder: Secondary | ICD-10-CM | POA: Diagnosis not present

## 2020-03-22 DIAGNOSIS — F340 Cyclothymic disorder: Secondary | ICD-10-CM | POA: Diagnosis not present

## 2020-03-22 DIAGNOSIS — F900 Attention-deficit hyperactivity disorder, predominantly inattentive type: Secondary | ICD-10-CM | POA: Diagnosis not present

## 2020-03-22 DIAGNOSIS — F4323 Adjustment disorder with mixed anxiety and depressed mood: Secondary | ICD-10-CM | POA: Diagnosis not present

## 2020-03-29 DIAGNOSIS — F900 Attention-deficit hyperactivity disorder, predominantly inattentive type: Secondary | ICD-10-CM | POA: Diagnosis not present

## 2020-04-17 DIAGNOSIS — F4323 Adjustment disorder with mixed anxiety and depressed mood: Secondary | ICD-10-CM | POA: Diagnosis not present

## 2020-04-17 DIAGNOSIS — F411 Generalized anxiety disorder: Secondary | ICD-10-CM | POA: Diagnosis not present

## 2020-04-25 DIAGNOSIS — Z124 Encounter for screening for malignant neoplasm of cervix: Secondary | ICD-10-CM | POA: Diagnosis not present

## 2020-04-25 DIAGNOSIS — Z01419 Encounter for gynecological examination (general) (routine) without abnormal findings: Secondary | ICD-10-CM | POA: Diagnosis not present

## 2020-04-25 DIAGNOSIS — Z1151 Encounter for screening for human papillomavirus (HPV): Secondary | ICD-10-CM | POA: Diagnosis not present

## 2020-04-25 DIAGNOSIS — Z309 Encounter for contraceptive management, unspecified: Secondary | ICD-10-CM | POA: Diagnosis not present

## 2020-05-15 DIAGNOSIS — F340 Cyclothymic disorder: Secondary | ICD-10-CM | POA: Diagnosis not present

## 2020-05-15 DIAGNOSIS — F411 Generalized anxiety disorder: Secondary | ICD-10-CM | POA: Diagnosis not present

## 2020-05-15 DIAGNOSIS — F900 Attention-deficit hyperactivity disorder, predominantly inattentive type: Secondary | ICD-10-CM | POA: Diagnosis not present

## 2020-05-15 DIAGNOSIS — F4323 Adjustment disorder with mixed anxiety and depressed mood: Secondary | ICD-10-CM | POA: Diagnosis not present

## 2020-06-11 DIAGNOSIS — N63 Unspecified lump in unspecified breast: Secondary | ICD-10-CM | POA: Diagnosis not present

## 2020-06-12 ENCOUNTER — Other Ambulatory Visit: Payer: Self-pay | Admitting: Family Medicine

## 2020-06-12 DIAGNOSIS — N63 Unspecified lump in unspecified breast: Secondary | ICD-10-CM

## 2020-06-13 DIAGNOSIS — F340 Cyclothymic disorder: Secondary | ICD-10-CM | POA: Diagnosis not present

## 2020-06-13 DIAGNOSIS — F900 Attention-deficit hyperactivity disorder, predominantly inattentive type: Secondary | ICD-10-CM | POA: Diagnosis not present

## 2020-06-13 DIAGNOSIS — F4323 Adjustment disorder with mixed anxiety and depressed mood: Secondary | ICD-10-CM | POA: Diagnosis not present

## 2020-06-26 ENCOUNTER — Ambulatory Visit
Admission: RE | Admit: 2020-06-26 | Discharge: 2020-06-26 | Disposition: A | Discharge status: Home / Self Care | Payer: BC Managed Care – PPO | Source: Ambulatory Visit | Attending: Family Medicine | Admitting: Family Medicine

## 2020-06-26 ENCOUNTER — Other Ambulatory Visit: Payer: Self-pay | Admitting: Family Medicine

## 2020-06-26 ENCOUNTER — Other Ambulatory Visit: Payer: Self-pay

## 2020-06-26 DIAGNOSIS — N63 Unspecified lump in unspecified breast: Secondary | ICD-10-CM

## 2020-06-26 DIAGNOSIS — N6489 Other specified disorders of breast: Secondary | ICD-10-CM | POA: Diagnosis not present

## 2020-06-26 DIAGNOSIS — N6314 Unspecified lump in the right breast, lower inner quadrant: Secondary | ICD-10-CM | POA: Diagnosis not present

## 2020-06-26 DIAGNOSIS — R928 Other abnormal and inconclusive findings on diagnostic imaging of breast: Secondary | ICD-10-CM | POA: Diagnosis not present

## 2020-06-26 DIAGNOSIS — N6312 Unspecified lump in the right breast, upper inner quadrant: Secondary | ICD-10-CM | POA: Diagnosis not present

## 2020-08-02 DIAGNOSIS — Z20822 Contact with and (suspected) exposure to covid-19: Secondary | ICD-10-CM | POA: Diagnosis not present

## 2020-08-28 ENCOUNTER — Other Ambulatory Visit: Payer: BC Managed Care – PPO

## 2020-09-28 DIAGNOSIS — F4323 Adjustment disorder with mixed anxiety and depressed mood: Secondary | ICD-10-CM | POA: Diagnosis not present

## 2020-09-28 DIAGNOSIS — F340 Cyclothymic disorder: Secondary | ICD-10-CM | POA: Diagnosis not present

## 2020-09-28 DIAGNOSIS — F900 Attention-deficit hyperactivity disorder, predominantly inattentive type: Secondary | ICD-10-CM | POA: Diagnosis not present

## 2020-10-11 DIAGNOSIS — M2560 Stiffness of unspecified joint, not elsewhere classified: Secondary | ICD-10-CM | POA: Diagnosis not present

## 2020-10-11 DIAGNOSIS — M2550 Pain in unspecified joint: Secondary | ICD-10-CM | POA: Diagnosis not present

## 2020-11-23 DIAGNOSIS — F900 Attention-deficit hyperactivity disorder, predominantly inattentive type: Secondary | ICD-10-CM | POA: Diagnosis not present

## 2020-11-23 DIAGNOSIS — F340 Cyclothymic disorder: Secondary | ICD-10-CM | POA: Diagnosis not present

## 2020-11-23 DIAGNOSIS — F4323 Adjustment disorder with mixed anxiety and depressed mood: Secondary | ICD-10-CM | POA: Diagnosis not present

## 2020-11-24 ENCOUNTER — Other Ambulatory Visit: Payer: Self-pay

## 2020-11-24 ENCOUNTER — Ambulatory Visit
Admission: RE | Admit: 2020-11-24 | Discharge: 2020-11-24 | Disposition: A | Discharge status: Home / Self Care | Payer: BC Managed Care – PPO | Source: Ambulatory Visit | Attending: Physician Assistant | Admitting: Physician Assistant

## 2020-11-24 VITALS — BP 158/108 | HR 102 | Temp 98.3°F | Resp 18

## 2020-11-24 DIAGNOSIS — R197 Diarrhea, unspecified: Secondary | ICD-10-CM | POA: Diagnosis not present

## 2020-11-24 DIAGNOSIS — R110 Nausea: Secondary | ICD-10-CM

## 2020-11-24 MED ORDER — ONDANSETRON HCL 4 MG PO TABS: 4.0000 mg | ORAL_TABLET | Freq: Three times a day (TID) | ORAL | 0 refills | Status: AC | PRN

## 2020-11-24 MED ORDER — ONDANSETRON 4 MG PO TBDP
4.0000 mg | ORAL_TABLET | Freq: Once | ORAL | Status: AC
  Administered 2020-11-24: 4 mg via ORAL

## 2020-11-24 NOTE — ED Triage Notes (Signed)
Pt here for diarrhea x multiple episodes starting last night with some abd pain

## 2020-11-24 NOTE — Discharge Instructions (Addendum)
Take Zofran as needed for nausea Push fluids

## 2020-11-24 NOTE — ED Provider Notes (Signed)
[name]    CSN: [id number] Arrival date & time: 11/24/20  1338      History   Chief Complaint Chief Complaint  Patient presents with  . Diarrhea  . Abdominal Pain    HPI Allison Gutierrez is a 35 y.o. female.   Pt complains of multiple episodes of diarrhea that started last night with intermittent abdominal pain.  Reports intermittent nausea.  Denies vomiting, fever, chills, cough, congestion. She reports symptoms have improved at this time.  No further episodes of diarrhea.  She has taken nothing for the sx. No sick contacts.      Past Medical History:  Diagnosis Date  . Bronchitis     Patient Active Problem List   Diagnosis Date Noted  . Impetigo 02/02/2014  . Seasonal allergies 02/02/2014    History reviewed. No pertinent surgical history.  OB History    Gravida  2   Para  0   Term  0   Preterm  0   AB  2   Living  0     SAB  0   IAB  2   Ectopic  0   Multiple  0   Live Births               Home Medications    Prior to Admission medications   Medication Sig Start Date End Date Taking? Authorizing Provider  ondansetron (ZOFRAN) 4 MG tablet Take 1 tablet (4 mg total) by mouth every 8 (eight) hours as needed for up to 7 days for nausea or vomiting. 11/24/20 12/01/20 Yes Shameca Landen, Shanda Bumps, PA-C  albuterol (PROVENTIL HFA;VENTOLIN HFA) 108 (90 Base) MCG/ACT inhaler Inhale 2 puffs into the lungs every 4 (four) hours as needed for wheezing or shortness of breath. 10/09/18   Mardella Layman, MD  Ascorbic Acid (VITAMIN C) 1000 MG tablet Take 1,000 mg by mouth daily.    [provider]  etonogestrel (IMPLANON) 68 MG IMPL implant Inject 1 each into the skin continuous.    [provider]  Pseudoephedrine-Ibuprofen (ADVIL COLD/SINUS PO) Take by mouth.    [provider]  Vitamin D, Ergocalciferol, (DRISDOL) 50000 units CAPS capsule Take 50,000 Units by mouth every 7 (seven) days.    [provider]   famotidine (PEPCID) 20 MG tablet Take 1 tablet (20 mg total) by mouth 2 (two) times daily. 05/05/14 09/12/19  Piepenbrink, Victorino Dike, PA-C  ipratropium (ATROVENT) 0.06 % nasal spray Place 2 sprays into both nostrils 4 (four) times daily. 09/22/16 09/12/19  Deatra Canter, FNP    Family History History reviewed. No pertinent family history.  Social History Social History   Tobacco Use  . Smoking status: Former Games developer  . Smokeless tobacco: Never Used  Substance Use Topics  . Alcohol use: Yes    Comment: social  . Drug use: No     Allergies   Eggs or egg-derived products, Prednisone, and Rice   Review of Systems Review of Systems  Constitutional: Negative for chills and fever.  HENT: Negative for ear pain and sore throat.   Eyes: Negative for pain and visual disturbance.  Respiratory: Negative for cough and shortness of breath.   Cardiovascular: Negative for chest pain and palpitations.  Gastrointestinal: Positive for abdominal pain, diarrhea and nausea. Negative for vomiting.  Genitourinary: Negative for dysuria and hematuria.  Musculoskeletal: Negative for arthralgias and back pain.  Skin: Negative for color change and rash.  Neurological: Negative for seizures and syncope.  All other systems reviewed  and are negative.    Physical Exam Triage Vital Signs ED Triage Vitals [11/24/20 1353]  Enc Vitals Group     BP (!) 158/108     Pulse Rate (!) 102     Resp 18     Temp 98.3 F (36.8 C)     Temp Source Oral     SpO2 95 %     Weight      Height      Head Circumference      Peak Flow      Pain Score 3     Pain Loc      Pain Edu?      Excl. in GC?    No data found.  Updated Vital Signs BP (!) 158/108 (BP Location: Left Arm)   Pulse (!) 102   Temp 98.3 F (36.8 C) (Oral)   Resp 18   SpO2 95%   Visual Acuity Right Eye Distance:   Left Eye Distance:   Bilateral Distance:    Right Eye Near:   Left Eye Near:    Bilateral Near:     Physical  Exam Vitals and nursing note reviewed.  Constitutional:      General: She is not in acute distress.    Appearance: She is well-developed and well-nourished.  HENT:     Head: Normocephalic and atraumatic.  Eyes:     Conjunctiva/sclera: Conjunctivae normal.  Cardiovascular:     Rate and Rhythm: Normal rate and regular rhythm.     Heart sounds: No murmur heard.   Pulmonary:     Effort: Pulmonary effort is normal. No respiratory distress.     Breath sounds: Normal breath sounds.  Abdominal:     Palpations: Abdomen is soft.     Tenderness: There is no abdominal tenderness.  Musculoskeletal:        General: No edema.     Cervical back: Neck supple.  Skin:    General: Skin is warm and dry.  Neurological:     Mental Status: She is alert.  Psychiatric:        Mood and Affect: Mood and affect normal.      UC Treatments / Results  Labs (all labs ordered are listed, but only abnormal results are displayed) Labs Reviewed - No data to display  EKG   Radiology No results found.  Procedures Procedures (including critical care time)  Medications Ordered in UC Medications  ondansetron (ZOFRAN-ODT) disintegrating tablet 4 mg (4 mg Oral Given 11/24/20 1407)    Initial Impression / Assessment and Plan / UC Course  I have reviewed the triage vital signs and the nursing notes.  Pertinent labs & imaging results that were available during my care of the patient were reviewed by me and considered in my medical decision making (see chart for details).     Benign abdominal exam.  Diarrhea resolved at this point.  Most likely viral GI illness.  Zofran prescribed to use as needed for nausea.  Return precautions discussed.  Final Clinical Impressions(s) / UC Diagnoses   Final diagnoses:  Diarrhea, unspecified type  Nausea without vomiting     Discharge Instructions     Take Zofran as needed for nausea Push fluids    ED Prescriptions    Medication Sig Dispense Auth. Provider    ondansetron (ZOFRAN) 4 MG tablet Take 1 tablet (4 mg total) by mouth every 8 (eight) hours as needed for up to 7 days for nausea or vomiting. 4 tablet Derrisha Foos,  Dorethea Clan     PDMP not reviewed this encounter.   Jodell Cipro, PA-C 11/24/20 [id number]

## 2020-11-30 DIAGNOSIS — R7309 Other abnormal glucose: Secondary | ICD-10-CM | POA: Diagnosis not present

## 2020-11-30 DIAGNOSIS — R946 Abnormal results of thyroid function studies: Secondary | ICD-10-CM | POA: Diagnosis not present

## 2020-11-30 DIAGNOSIS — Z0000 Encounter for general adult medical examination without abnormal findings: Secondary | ICD-10-CM | POA: Diagnosis not present

## 2020-12-06 DIAGNOSIS — M1710 Unilateral primary osteoarthritis, unspecified knee: Secondary | ICD-10-CM | POA: Diagnosis not present

## 2020-12-06 DIAGNOSIS — R7309 Other abnormal glucose: Secondary | ICD-10-CM | POA: Diagnosis not present

## 2020-12-06 DIAGNOSIS — Z0000 Encounter for general adult medical examination without abnormal findings: Secondary | ICD-10-CM | POA: Diagnosis not present

## 2020-12-06 DIAGNOSIS — F900 Attention-deficit hyperactivity disorder, predominantly inattentive type: Secondary | ICD-10-CM | POA: Diagnosis not present

## 2021-01-08 ENCOUNTER — Encounter: Payer: Self-pay | Admitting: Physician Assistant

## 2021-01-17 DIAGNOSIS — F4323 Adjustment disorder with mixed anxiety and depressed mood: Secondary | ICD-10-CM | POA: Diagnosis not present

## 2021-01-17 DIAGNOSIS — F340 Cyclothymic disorder: Secondary | ICD-10-CM | POA: Diagnosis not present

## 2021-01-17 DIAGNOSIS — F900 Attention-deficit hyperactivity disorder, predominantly inattentive type: Secondary | ICD-10-CM | POA: Diagnosis not present

## 2021-01-23 ENCOUNTER — Ambulatory Visit (INDEPENDENT_AMBULATORY_CARE_PROVIDER_SITE_OTHER): Payer: BC Managed Care – PPO | Admitting: Physician Assistant

## 2021-01-23 ENCOUNTER — Encounter: Payer: Self-pay | Admitting: Physician Assistant

## 2021-01-23 VITALS — BP 126/80 | HR 112 | Ht 65.75 in | Wt 314.4 lb

## 2021-01-23 DIAGNOSIS — R109 Unspecified abdominal pain: Secondary | ICD-10-CM | POA: Diagnosis not present

## 2021-01-23 DIAGNOSIS — K5900 Constipation, unspecified: Secondary | ICD-10-CM | POA: Diagnosis not present

## 2021-01-23 DIAGNOSIS — R197 Diarrhea, unspecified: Secondary | ICD-10-CM | POA: Diagnosis not present

## 2021-01-23 NOTE — Patient Instructions (Signed)
If you are age 34 or older, your body mass index should be between 23-30. Your Body mass index is 51.13 kg/m. If this is out of the aforementioned range listed, please consider follow up with your Primary Care Provider.  If you are age 92 or younger, your body mass index should be between 19-25. Your Body mass index is 51.13 kg/m. If this is out of the aformentioned range listed, please consider follow up with your Primary Care Provider.   Please make an appointment to see your allergist.  Start Miralax as needed for constipation.  Follow up as needed.  Thank you for choosing me and [facility].  Hyacinth Meeker, PA-C

## 2021-01-23 NOTE — Progress Notes (Signed)
Chief Complaint: Follow-up urgent care visit for diarrhea  HPI:     Allison Gutierrez is a 35 year old African-American female with past medical history as listed below, who was referred to me by Irena Reichmann, DO for follow-up after being seen in the urgent care for diarrhea.      11/24/2020 patient seen in the urgent care for diarrhea.  She described multiple episodes which had started the night before with intermittent abdominal pain and nausea.  She had no labs or imaging and was given Zofran.  Her diarrhea had resolved at that point and it was thought this is most likely a viral GI illness.    Today, the patient tells me that she has had 4 separate episodes all involving shrimp/scallops after which she has had extreme abdominal pain and diarrhea for about 6 to 7 hours.  Initially she was not sure this was truly just a shrimp/scallops but she reviewed her food logs and it was.  The last time she had shrimp she just ate 1 and it caused mild abdominal discomfort but did not give her all of her symptoms.  Tells me since then she has avoided all seafood.  Apparently saw an allergist back in 2019 who told her that she was semiallergic to scallops and shrimp, but at that time she was having no problem eating these.  Denies any of the symptoms when she does not eat shrimp or scallops.  Does tell me that occasionally she will be constipated since being started on Saxenda for weight loss.  At that time she will typically use mag citrate which takes a few hours to work.    Denies fever, chills or blood in her stool  Past Medical History:  Diagnosis Date  . Bronchitis     No past surgical history on file.  Current Outpatient Medications  Medication Sig Dispense Refill  . albuterol (PROVENTIL HFA;VENTOLIN HFA) 108 (90 Base) MCG/ACT inhaler Inhale 2 puffs into the lungs every 4 (four) hours as needed for wheezing or shortness of breath. 1 Inhaler 1  . Ascorbic Acid (VITAMIN C) 1000 MG tablet Take 1,000 mg by  mouth daily.    Marland Kitchen etonogestrel (IMPLANON) 68 MG IMPL implant Inject 1 each into the skin continuous.    . Pseudoephedrine-Ibuprofen (ADVIL COLD/SINUS PO) Take by mouth.    . Vitamin D, Ergocalciferol, (DRISDOL) 50000 units CAPS capsule Take 50,000 Units by mouth every 7 (seven) days.     No current facility-administered medications for this visit.    Allergies as of 01/23/2021 - Review Complete 11/24/2020  Allergen Reaction Noted  . Eggs or egg-derived products  09/12/2019  . Prednisone  12/17/2015  . Rice  09/12/2019    No family history on file.  Social History   Socioeconomic History  . Marital status: Single    Spouse name: Not on file  . Number of children: Not on file  . Years of education: Not on file  . Highest education level: Not on file  Occupational History  . Not on file  Tobacco Use  . Smoking status: Former Games developer  . Smokeless tobacco: Never Used  Substance and Sexual Activity  . Alcohol use: Yes    Comment: social  . Drug use: No  . Sexual activity: Not on file  Other Topics Concern  . Not on file  Social History Narrative  . Not on file   Social Determinants of Health   Financial Resource Strain: Not on file  Food Insecurity: Not  on file  Transportation Needs: Not on file  Physical Activity: Not on file  Stress: Not on file  Social Connections: Not on file  Intimate Partner Violence: Not on file    Review of Systems:    Constitutional: No weight loss, fever or chills Skin: No rash Cardiovascular: No chest pain Respiratory: No SOB Gastrointestinal: See HPI and otherwise negative Genitourinary: No dysuria Neurological: No headache, dizziness or syncope Musculoskeletal: No new muscle or joint pain Hematologic: No bleeding  Psychiatric: No history of depression or anxiety   Physical Exam:  Vital signs: BP 126/80 (BP Location: Left Arm, Patient Position: Sitting, Cuff Size: Large)   Pulse (!) 112   Ht 5' 5.75" (1.67 m) Comment: height  measured without shoes  Wt (!) 314 lb 6 oz (142.6 kg)   LMP 01/11/2021   BMI 51.13 kg/m   Constitutional:   Pleasant obese AA female appears to be in NAD, Well developed, Well nourished, alert and cooperative Head:  Normocephalic and atraumatic. Eyes:   PEERL, EOMI. No icterus. Conjunctiva pink. Ears:  Normal auditory acuity. Neck:  Supple Throat: Oral cavity and pharynx without inflammation, swelling or lesion.  Respiratory: Respirations even and unlabored. Lungs clear to auscultation bilaterally.   No wheezes, crackles, or rhonchi.  Cardiovascular: Normal S1, S2. No MRG. Regular rate and rhythm. No peripheral edema, cyanosis or pallor.  Gastrointestinal:  Soft, nondistended, nontender. No rebound or guarding. Normal bowel sounds. No appreciable masses or hepatomegaly. Rectal:  Not performed.  Msk:  Symmetrical without gross deformities. Without edema, no deformity or joint abnormality.  Neurologic:  Alert and  oriented x4;  grossly normal neurologically.  Skin:   Dry and intact without significant lesions or rashes. Psychiatric: Demonstrates good judgement and reason without abnormal affect or behaviors.  No recent labs or imaging.  Assessment: 1.  Generalized abdominal cramping and diarrhea: Also with nausea after eating shrimp or scallops, this is occurred on 4 separate occasions only with these foods; most likely allergic 2.  Constipation: Occasionally since being placed on Saxenda  Plan: 1.  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  I am not sure if this will cover all of seafood or not.  Recommend that she go back to see her allergist to discuss further since she is now having symptoms with these foods. 2.  For constipation would recommend the patient use MiraLAX, discussed that she can use up to 4 doses a day if needed. 3.  Patient will call us if she has any of these episodes when she has not eaten shrimp or scallops. 4.  Patient assigned  to Dr. Meridee Score this afternoon.  Hyacinth Meeker, PA-C [facility] 01/23/2021, 3:36 PM  Cc: Irena Reichmann, DO

## 2021-01-24 NOTE — Progress Notes (Signed)
Attending Physician's Attestation   I have reviewed the chart.   I agree with the Advanced Practitioner's note, impression, and recommendations with any updates as below. Agree with further work-up by allergy.  If things persist then diagnostic EGD/colonoscopy will be recommended in the future.   Corliss Parish, MD [facility] Advanced Endoscopy Office # [phone]

## 2021-02-27 DIAGNOSIS — L292 Pruritus vulvae: Secondary | ICD-10-CM | POA: Diagnosis not present

## 2021-03-28 DIAGNOSIS — R7309 Other abnormal glucose: Secondary | ICD-10-CM | POA: Diagnosis not present

## 2021-04-04 DIAGNOSIS — R7309 Other abnormal glucose: Secondary | ICD-10-CM | POA: Diagnosis not present

## 2021-04-04 DIAGNOSIS — Z0000 Encounter for general adult medical examination without abnormal findings: Secondary | ICD-10-CM | POA: Diagnosis not present

## 2021-04-04 DIAGNOSIS — R946 Abnormal results of thyroid function studies: Secondary | ICD-10-CM | POA: Diagnosis not present

## 2021-04-04 DIAGNOSIS — M1710 Unilateral primary osteoarthritis, unspecified knee: Secondary | ICD-10-CM | POA: Diagnosis not present

## 2021-04-12 DIAGNOSIS — F340 Cyclothymic disorder: Secondary | ICD-10-CM | POA: Diagnosis not present

## 2021-04-12 DIAGNOSIS — F4323 Adjustment disorder with mixed anxiety and depressed mood: Secondary | ICD-10-CM | POA: Diagnosis not present

## 2021-04-12 DIAGNOSIS — F900 Attention-deficit hyperactivity disorder, predominantly inattentive type: Secondary | ICD-10-CM | POA: Diagnosis not present

## 2021-04-25 ENCOUNTER — Ambulatory Visit: Payer: BC Managed Care – PPO

## 2021-04-29 DIAGNOSIS — Z01419 Encounter for gynecological examination (general) (routine) without abnormal findings: Secondary | ICD-10-CM | POA: Diagnosis not present

## 2021-05-01 DIAGNOSIS — Z20822 Contact with and (suspected) exposure to covid-19: Secondary | ICD-10-CM | POA: Diagnosis not present

## 2021-05-11 DIAGNOSIS — Z20822 Contact with and (suspected) exposure to covid-19: Secondary | ICD-10-CM | POA: Diagnosis not present

## 2021-07-03 DIAGNOSIS — F340 Cyclothymic disorder: Secondary | ICD-10-CM | POA: Diagnosis not present

## 2021-07-03 DIAGNOSIS — F900 Attention-deficit hyperactivity disorder, predominantly inattentive type: Secondary | ICD-10-CM | POA: Diagnosis not present

## 2021-07-03 DIAGNOSIS — F4323 Adjustment disorder with mixed anxiety and depressed mood: Secondary | ICD-10-CM | POA: Diagnosis not present

## 2021-07-10 DIAGNOSIS — F900 Attention-deficit hyperactivity disorder, predominantly inattentive type: Secondary | ICD-10-CM | POA: Diagnosis not present

## 2021-07-10 DIAGNOSIS — F411 Generalized anxiety disorder: Secondary | ICD-10-CM | POA: Diagnosis not present

## 2021-07-10 DIAGNOSIS — F4323 Adjustment disorder with mixed anxiety and depressed mood: Secondary | ICD-10-CM | POA: Diagnosis not present

## 2021-07-16 DIAGNOSIS — F411 Generalized anxiety disorder: Secondary | ICD-10-CM | POA: Diagnosis not present

## 2021-07-16 DIAGNOSIS — F4323 Adjustment disorder with mixed anxiety and depressed mood: Secondary | ICD-10-CM | POA: Diagnosis not present

## 2021-07-16 DIAGNOSIS — F900 Attention-deficit hyperactivity disorder, predominantly inattentive type: Secondary | ICD-10-CM | POA: Diagnosis not present

## 2021-07-23 DIAGNOSIS — M62838 Other muscle spasm: Secondary | ICD-10-CM | POA: Diagnosis not present

## 2021-07-23 DIAGNOSIS — F900 Attention-deficit hyperactivity disorder, predominantly inattentive type: Secondary | ICD-10-CM | POA: Diagnosis not present

## 2021-07-23 DIAGNOSIS — F4323 Adjustment disorder with mixed anxiety and depressed mood: Secondary | ICD-10-CM | POA: Diagnosis not present

## 2021-07-23 DIAGNOSIS — M9901 Segmental and somatic dysfunction of cervical region: Secondary | ICD-10-CM | POA: Diagnosis not present

## 2021-07-23 DIAGNOSIS — M9902 Segmental and somatic dysfunction of thoracic region: Secondary | ICD-10-CM | POA: Diagnosis not present

## 2021-07-23 DIAGNOSIS — M542 Cervicalgia: Secondary | ICD-10-CM | POA: Diagnosis not present

## 2021-07-23 DIAGNOSIS — F411 Generalized anxiety disorder: Secondary | ICD-10-CM | POA: Diagnosis not present

## 2021-07-30 DIAGNOSIS — F4323 Adjustment disorder with mixed anxiety and depressed mood: Secondary | ICD-10-CM | POA: Diagnosis not present

## 2021-07-30 DIAGNOSIS — F900 Attention-deficit hyperactivity disorder, predominantly inattentive type: Secondary | ICD-10-CM | POA: Diagnosis not present

## 2021-07-30 DIAGNOSIS — F411 Generalized anxiety disorder: Secondary | ICD-10-CM | POA: Diagnosis not present

## 2021-07-31 DIAGNOSIS — M542 Cervicalgia: Secondary | ICD-10-CM | POA: Diagnosis not present

## 2021-07-31 DIAGNOSIS — M9901 Segmental and somatic dysfunction of cervical region: Secondary | ICD-10-CM | POA: Diagnosis not present

## 2021-07-31 DIAGNOSIS — M62838 Other muscle spasm: Secondary | ICD-10-CM | POA: Diagnosis not present

## 2021-07-31 DIAGNOSIS — M9902 Segmental and somatic dysfunction of thoracic region: Secondary | ICD-10-CM | POA: Diagnosis not present

## 2021-08-05 DIAGNOSIS — M545 Low back pain, unspecified: Secondary | ICD-10-CM | POA: Diagnosis not present

## 2021-08-05 DIAGNOSIS — M62830 Muscle spasm of back: Secondary | ICD-10-CM | POA: Diagnosis not present

## 2021-08-05 DIAGNOSIS — M9902 Segmental and somatic dysfunction of thoracic region: Secondary | ICD-10-CM | POA: Diagnosis not present

## 2021-08-05 DIAGNOSIS — M62838 Other muscle spasm: Secondary | ICD-10-CM | POA: Diagnosis not present

## 2021-08-05 DIAGNOSIS — M9903 Segmental and somatic dysfunction of lumbar region: Secondary | ICD-10-CM | POA: Diagnosis not present

## 2021-08-05 DIAGNOSIS — M9901 Segmental and somatic dysfunction of cervical region: Secondary | ICD-10-CM | POA: Diagnosis not present

## 2021-08-05 DIAGNOSIS — M542 Cervicalgia: Secondary | ICD-10-CM | POA: Diagnosis not present

## 2021-08-06 DIAGNOSIS — F4323 Adjustment disorder with mixed anxiety and depressed mood: Secondary | ICD-10-CM | POA: Diagnosis not present

## 2021-08-06 DIAGNOSIS — F900 Attention-deficit hyperactivity disorder, predominantly inattentive type: Secondary | ICD-10-CM | POA: Diagnosis not present

## 2021-08-06 DIAGNOSIS — F411 Generalized anxiety disorder: Secondary | ICD-10-CM | POA: Diagnosis not present

## 2021-08-07 DIAGNOSIS — M9902 Segmental and somatic dysfunction of thoracic region: Secondary | ICD-10-CM | POA: Diagnosis not present

## 2021-08-07 DIAGNOSIS — M9901 Segmental and somatic dysfunction of cervical region: Secondary | ICD-10-CM | POA: Diagnosis not present

## 2021-08-07 DIAGNOSIS — M62830 Muscle spasm of back: Secondary | ICD-10-CM | POA: Diagnosis not present

## 2021-08-07 DIAGNOSIS — M545 Low back pain, unspecified: Secondary | ICD-10-CM | POA: Diagnosis not present

## 2021-08-07 DIAGNOSIS — M9903 Segmental and somatic dysfunction of lumbar region: Secondary | ICD-10-CM | POA: Diagnosis not present

## 2021-08-07 DIAGNOSIS — M62838 Other muscle spasm: Secondary | ICD-10-CM | POA: Diagnosis not present

## 2021-08-07 DIAGNOSIS — M542 Cervicalgia: Secondary | ICD-10-CM | POA: Diagnosis not present

## 2021-08-12 DIAGNOSIS — M9903 Segmental and somatic dysfunction of lumbar region: Secondary | ICD-10-CM | POA: Diagnosis not present

## 2021-08-12 DIAGNOSIS — M545 Low back pain, unspecified: Secondary | ICD-10-CM | POA: Diagnosis not present

## 2021-08-12 DIAGNOSIS — M62830 Muscle spasm of back: Secondary | ICD-10-CM | POA: Diagnosis not present

## 2021-08-12 DIAGNOSIS — M62838 Other muscle spasm: Secondary | ICD-10-CM | POA: Diagnosis not present

## 2021-08-12 DIAGNOSIS — M9901 Segmental and somatic dysfunction of cervical region: Secondary | ICD-10-CM | POA: Diagnosis not present

## 2021-08-12 DIAGNOSIS — M542 Cervicalgia: Secondary | ICD-10-CM | POA: Diagnosis not present

## 2021-08-12 DIAGNOSIS — M9902 Segmental and somatic dysfunction of thoracic region: Secondary | ICD-10-CM | POA: Diagnosis not present

## 2021-08-13 DIAGNOSIS — F411 Generalized anxiety disorder: Secondary | ICD-10-CM | POA: Diagnosis not present

## 2021-08-13 DIAGNOSIS — F4323 Adjustment disorder with mixed anxiety and depressed mood: Secondary | ICD-10-CM | POA: Diagnosis not present

## 2021-08-13 DIAGNOSIS — F900 Attention-deficit hyperactivity disorder, predominantly inattentive type: Secondary | ICD-10-CM | POA: Diagnosis not present

## 2021-08-14 DIAGNOSIS — M9901 Segmental and somatic dysfunction of cervical region: Secondary | ICD-10-CM | POA: Diagnosis not present

## 2021-08-14 DIAGNOSIS — M545 Low back pain, unspecified: Secondary | ICD-10-CM | POA: Diagnosis not present

## 2021-08-14 DIAGNOSIS — M62838 Other muscle spasm: Secondary | ICD-10-CM | POA: Diagnosis not present

## 2021-08-14 DIAGNOSIS — M62830 Muscle spasm of back: Secondary | ICD-10-CM | POA: Diagnosis not present

## 2021-08-14 DIAGNOSIS — M542 Cervicalgia: Secondary | ICD-10-CM | POA: Diagnosis not present

## 2021-08-14 DIAGNOSIS — M9902 Segmental and somatic dysfunction of thoracic region: Secondary | ICD-10-CM | POA: Diagnosis not present

## 2021-08-14 DIAGNOSIS — M9903 Segmental and somatic dysfunction of lumbar region: Secondary | ICD-10-CM | POA: Diagnosis not present

## 2021-08-19 DIAGNOSIS — M62830 Muscle spasm of back: Secondary | ICD-10-CM | POA: Diagnosis not present

## 2021-08-19 DIAGNOSIS — M9901 Segmental and somatic dysfunction of cervical region: Secondary | ICD-10-CM | POA: Diagnosis not present

## 2021-08-19 DIAGNOSIS — M545 Low back pain, unspecified: Secondary | ICD-10-CM | POA: Diagnosis not present

## 2021-08-19 DIAGNOSIS — M62838 Other muscle spasm: Secondary | ICD-10-CM | POA: Diagnosis not present

## 2021-08-19 DIAGNOSIS — M542 Cervicalgia: Secondary | ICD-10-CM | POA: Diagnosis not present

## 2021-08-19 DIAGNOSIS — M9903 Segmental and somatic dysfunction of lumbar region: Secondary | ICD-10-CM | POA: Diagnosis not present

## 2021-08-19 DIAGNOSIS — M9902 Segmental and somatic dysfunction of thoracic region: Secondary | ICD-10-CM | POA: Diagnosis not present

## 2021-08-20 DIAGNOSIS — F900 Attention-deficit hyperactivity disorder, predominantly inattentive type: Secondary | ICD-10-CM | POA: Diagnosis not present

## 2021-08-20 DIAGNOSIS — F411 Generalized anxiety disorder: Secondary | ICD-10-CM | POA: Diagnosis not present

## 2021-08-20 DIAGNOSIS — F4323 Adjustment disorder with mixed anxiety and depressed mood: Secondary | ICD-10-CM | POA: Diagnosis not present

## 2021-08-21 DIAGNOSIS — M62830 Muscle spasm of back: Secondary | ICD-10-CM | POA: Diagnosis not present

## 2021-08-21 DIAGNOSIS — M545 Low back pain, unspecified: Secondary | ICD-10-CM | POA: Diagnosis not present

## 2021-08-21 DIAGNOSIS — M542 Cervicalgia: Secondary | ICD-10-CM | POA: Diagnosis not present

## 2021-08-21 DIAGNOSIS — M9903 Segmental and somatic dysfunction of lumbar region: Secondary | ICD-10-CM | POA: Diagnosis not present

## 2021-08-21 DIAGNOSIS — M9902 Segmental and somatic dysfunction of thoracic region: Secondary | ICD-10-CM | POA: Diagnosis not present

## 2021-08-21 DIAGNOSIS — M62838 Other muscle spasm: Secondary | ICD-10-CM | POA: Diagnosis not present

## 2021-08-21 DIAGNOSIS — M9901 Segmental and somatic dysfunction of cervical region: Secondary | ICD-10-CM | POA: Diagnosis not present

## 2021-08-23 ENCOUNTER — Ambulatory Visit
Admission: EM | Admit: 2021-08-23 | Discharge: 2021-08-23 | Disposition: A | Discharge status: Home / Self Care | Payer: BC Managed Care – PPO | Attending: Emergency Medicine | Admitting: Emergency Medicine

## 2021-08-23 ENCOUNTER — Encounter: Payer: Self-pay | Admitting: Emergency Medicine

## 2021-08-23 DIAGNOSIS — K529 Noninfective gastroenteritis and colitis, unspecified: Secondary | ICD-10-CM | POA: Diagnosis not present

## 2021-08-23 LAB — POCT INFLUENZA A/B
Influenza A, POC: NEGATIVE
Influenza B, POC: NEGATIVE

## 2021-08-23 NOTE — ED Provider Notes (Signed)
Chief Complaint   Chief Complaint  Patient presents with   Emesis   Nausea   Diarrhea     Subjective, HPI  Allison Gutierrez is a very pleasant 35 y.o. female who presents with vomiting and diarrhea.  Patient reports that her vomiting has resolved.  Initial onset of symptoms 2 days ago.  Patient reports that she still has diarrhea, but is able to keep down fluids.  Patient reports that she has not eaten any solid food yet.  Patient reports having an ED visit yesterday and was prescribed Bentyl.  No fever, chest pain, dizziness, shortness of breath reported.  No blood in stool or emesis reported.  History obtained from patient.   Patient's problem list, past medical and social history, medications, and allergies were reviewed by me and updated in Epic.    ROS  See HPI.  Objective   Vitals:   08/23/21 1540  BP: 137/84  Pulse: 95  Resp: 18  Temp: 99 F (37.2 C)  SpO2: 97%    Vital signs and nursing note reviewed.   No LMP recorded.   General: Appears well-developed and well-nourished. No acute distress.  HEENT Head: Normocephalic and atraumatic.  Eyes: Conjunctivae and EOM are normal. No eye drainage or scleral icterus bilaterally.  Ears: Hearing grossly intact, no drainage or visible deformity.  Nose: No nasal deviation or rhinorrhea.  Mouth/Throat: No stridor or tracheal deviation. Moist mucous membranes.  Neck: Normal range of motion, neck is supple.  Cardiovascular: Normal rate. Regular rhythm; no murmurs, gallops, or rubs. Pulm/Chest: No respiratory distress. Breath sounds normal bilaterally without wheezes, rhonchi, or rales.  Abdominal: Soft, non-distended, no guarding. Bowel tones normotonic in all quadrants. Musculoskeletal: No joint deformity, normal range of motion.  Neurological: Alert and oriented to person, place, and time.  Skin: Skin is warm and dry.  Psychiatric: Normal mood, affect, behavior, and thought content.   Assessment & Plan  1. Gastroenteritis    35 y.o. female presents with vomiting and diarrhea.  Patient reports that her vomiting has resolved.  Initial onset of symptoms 2 days ago.  Patient reports that she still has diarrhea, but is able to keep down fluids.  Patient reports that she has not eaten any solid food yet.  Patient reports having an ED visit yesterday and was prescribed Bentyl.  No fever, chest pain, dizziness, shortness of breath reported.  No blood in stool or emesis reported.  Influenza: Negative.  Likely, gastroenteritis.  Advised about home treatment and care to include the BRAT diet, pushing fluids.  Advised to return to clinic or go to the ED for any uncontrolled nausea/vomiting, fever, worsening of abdominal pain, blood in the vomit or stool, new or worsening fever, signs of dehydration.  Patient verbalized understanding and agreed with plan.  Patient stable upon discharge.  Return as needed.  Plan:   Discharge Instructions      For your nausea, vomiting, upset stomach, and/or diarrhea:  Clear liquid diet (broth, popsicles, jello) for the next 12 - 24 hours then advance to a bland diet (BRAT), slowly advancing over the next two-to-three days as tolerated. BRAT diet as nausea subsides (Bananas, Rice, Apple sauce and Toast/crackers; this is a guide rather than an absolut diet plan). Push clear liquids: water, diluted Gatorade, Pedialyte, or WHO oral rehydration solution: 1 liter water, 8 tsp sugar, 1 tsp table salt,  cup orange juice or  mashed banana. Start with 1 teaspoon of liquid and increase to 2 ounces every 10 minutes,  if tolerated, may increase to 8 ounces. Avoid drinks with high amounts of sugar, acidity or caffeine while ill. No meat, fried or fatty products, or ibuprofen for several days. Please be re-evaluated if your symptoms are not improving over the next 2 days.  Return to clinic or go to the emergency department for any uncontrolled nausea/vomiting, fever, worsening of abdominal pain, blood in the  vomit or stool, new or worsening fever, signs of dehydration (decreased urination, dizziness, weakness).           Amalia Greenhouse, FNP 08/23/21 1637

## 2021-08-23 NOTE — Discharge Instructions (Signed)
For your nausea, vomiting, upset stomach, and/or diarrhea:  Clear liquid diet (broth, popsicles, jello) for the next 12 - 24 hours then advance to a bland diet (BRAT), slowly advancing over the next two-to-three days as tolerated. BRAT diet as nausea subsides (Bananas, Rice, Apple sauce and Toast/crackers; this is a guide rather than an absolut diet plan). Push clear liquids: water, diluted Gatorade, Pedialyte, or WHO oral rehydration solution: 1 liter water, 8 tsp sugar, 1 tsp table salt,  cup orange juice or  mashed banana. Start with 1 teaspoon of liquid and increase to 2 ounces every 10 minutes, if tolerated, may increase to 8 ounces. Avoid drinks with high amounts of sugar, acidity or caffeine while ill. No meat, fried or fatty products, or ibuprofen for several days. Please be re-evaluated if your symptoms are not improving over the next 2 days.  Return to clinic or go to the emergency department for any uncontrolled nausea/vomiting, fever, worsening of abdominal pain, blood in the vomit or stool, new or worsening fever, signs of dehydration (decreased urination, dizziness, weakness).

## 2021-08-23 NOTE — ED Triage Notes (Addendum)
Pt here with vomiting that resolved over the last day. Still has diarrhea, but is able to keep down fluids, but has not had any solid food yet. This all started to occur after eating fried shrimp 2 nights ago. Pt is on Concerta. Had E-visit yesterday and was given Bentyl.

## 2021-08-24 ENCOUNTER — Ambulatory Visit: Payer: Self-pay

## 2021-08-26 DIAGNOSIS — M542 Cervicalgia: Secondary | ICD-10-CM | POA: Diagnosis not present

## 2021-08-26 DIAGNOSIS — M545 Low back pain, unspecified: Secondary | ICD-10-CM | POA: Diagnosis not present

## 2021-08-26 DIAGNOSIS — M62830 Muscle spasm of back: Secondary | ICD-10-CM | POA: Diagnosis not present

## 2021-08-26 DIAGNOSIS — M9901 Segmental and somatic dysfunction of cervical region: Secondary | ICD-10-CM | POA: Diagnosis not present

## 2021-08-26 DIAGNOSIS — M9902 Segmental and somatic dysfunction of thoracic region: Secondary | ICD-10-CM | POA: Diagnosis not present

## 2021-08-26 DIAGNOSIS — M62838 Other muscle spasm: Secondary | ICD-10-CM | POA: Diagnosis not present

## 2021-08-26 DIAGNOSIS — M9903 Segmental and somatic dysfunction of lumbar region: Secondary | ICD-10-CM | POA: Diagnosis not present

## 2021-08-27 DIAGNOSIS — F411 Generalized anxiety disorder: Secondary | ICD-10-CM | POA: Diagnosis not present

## 2021-08-27 DIAGNOSIS — F900 Attention-deficit hyperactivity disorder, predominantly inattentive type: Secondary | ICD-10-CM | POA: Diagnosis not present

## 2021-08-27 DIAGNOSIS — F4323 Adjustment disorder with mixed anxiety and depressed mood: Secondary | ICD-10-CM | POA: Diagnosis not present

## 2021-09-03 DIAGNOSIS — F4323 Adjustment disorder with mixed anxiety and depressed mood: Secondary | ICD-10-CM | POA: Diagnosis not present

## 2021-09-03 DIAGNOSIS — F900 Attention-deficit hyperactivity disorder, predominantly inattentive type: Secondary | ICD-10-CM | POA: Diagnosis not present

## 2021-09-03 DIAGNOSIS — F411 Generalized anxiety disorder: Secondary | ICD-10-CM | POA: Diagnosis not present

## 2021-09-09 DIAGNOSIS — J45901 Unspecified asthma with (acute) exacerbation: Secondary | ICD-10-CM | POA: Diagnosis not present

## 2021-09-17 DIAGNOSIS — F411 Generalized anxiety disorder: Secondary | ICD-10-CM | POA: Diagnosis not present

## 2021-09-17 DIAGNOSIS — F900 Attention-deficit hyperactivity disorder, predominantly inattentive type: Secondary | ICD-10-CM | POA: Diagnosis not present

## 2021-09-17 DIAGNOSIS — F4323 Adjustment disorder with mixed anxiety and depressed mood: Secondary | ICD-10-CM | POA: Diagnosis not present

## 2021-09-23 DIAGNOSIS — F4323 Adjustment disorder with mixed anxiety and depressed mood: Secondary | ICD-10-CM | POA: Diagnosis not present

## 2021-09-23 DIAGNOSIS — F900 Attention-deficit hyperactivity disorder, predominantly inattentive type: Secondary | ICD-10-CM | POA: Diagnosis not present

## 2021-09-23 DIAGNOSIS — F340 Cyclothymic disorder: Secondary | ICD-10-CM | POA: Diagnosis not present

## 2021-09-24 DIAGNOSIS — F900 Attention-deficit hyperactivity disorder, predominantly inattentive type: Secondary | ICD-10-CM | POA: Diagnosis not present

## 2021-09-24 DIAGNOSIS — F4323 Adjustment disorder with mixed anxiety and depressed mood: Secondary | ICD-10-CM | POA: Diagnosis not present

## 2021-09-24 DIAGNOSIS — F411 Generalized anxiety disorder: Secondary | ICD-10-CM | POA: Diagnosis not present

## 2021-10-01 DIAGNOSIS — F4323 Adjustment disorder with mixed anxiety and depressed mood: Secondary | ICD-10-CM | POA: Diagnosis not present

## 2021-10-01 DIAGNOSIS — F411 Generalized anxiety disorder: Secondary | ICD-10-CM | POA: Diagnosis not present

## 2021-10-01 DIAGNOSIS — F900 Attention-deficit hyperactivity disorder, predominantly inattentive type: Secondary | ICD-10-CM | POA: Diagnosis not present

## 2021-10-22 DIAGNOSIS — F4323 Adjustment disorder with mixed anxiety and depressed mood: Secondary | ICD-10-CM | POA: Diagnosis not present

## 2021-10-22 DIAGNOSIS — F900 Attention-deficit hyperactivity disorder, predominantly inattentive type: Secondary | ICD-10-CM | POA: Diagnosis not present

## 2021-10-22 DIAGNOSIS — F411 Generalized anxiety disorder: Secondary | ICD-10-CM | POA: Diagnosis not present

## 2021-12-02 DIAGNOSIS — Z0000 Encounter for general adult medical examination without abnormal findings: Secondary | ICD-10-CM | POA: Diagnosis not present

## 2021-12-02 DIAGNOSIS — R7309 Other abnormal glucose: Secondary | ICD-10-CM | POA: Diagnosis not present

## 2021-12-02 DIAGNOSIS — R946 Abnormal results of thyroid function studies: Secondary | ICD-10-CM | POA: Diagnosis not present

## 2021-12-23 DIAGNOSIS — R110 Nausea: Secondary | ICD-10-CM | POA: Diagnosis not present

## 2021-12-23 DIAGNOSIS — F4323 Adjustment disorder with mixed anxiety and depressed mood: Secondary | ICD-10-CM | POA: Diagnosis not present

## 2021-12-23 DIAGNOSIS — Z79899 Other long term (current) drug therapy: Secondary | ICD-10-CM | POA: Diagnosis not present

## 2021-12-23 DIAGNOSIS — Z888 Allergy status to other drugs, medicaments and biological substances status: Secondary | ICD-10-CM | POA: Diagnosis not present

## 2021-12-23 DIAGNOSIS — F340 Cyclothymic disorder: Secondary | ICD-10-CM | POA: Diagnosis not present

## 2021-12-23 DIAGNOSIS — R1013 Epigastric pain: Secondary | ICD-10-CM | POA: Diagnosis not present

## 2021-12-23 DIAGNOSIS — F900 Attention-deficit hyperactivity disorder, predominantly inattentive type: Secondary | ICD-10-CM | POA: Diagnosis not present

## 2021-12-23 DIAGNOSIS — R1011 Right upper quadrant pain: Secondary | ICD-10-CM | POA: Diagnosis not present

## 2021-12-23 DIAGNOSIS — Z791 Long term (current) use of non-steroidal anti-inflammatories (NSAID): Secondary | ICD-10-CM | POA: Diagnosis not present

## 2021-12-25 DIAGNOSIS — R1011 Right upper quadrant pain: Secondary | ICD-10-CM | POA: Diagnosis not present

## 2021-12-26 ENCOUNTER — Other Ambulatory Visit: Payer: Self-pay | Admitting: Family Medicine

## 2021-12-27 ENCOUNTER — Other Ambulatory Visit (HOSPITAL_COMMUNITY): Payer: Self-pay | Admitting: Family Medicine

## 2021-12-27 DIAGNOSIS — R110 Nausea: Secondary | ICD-10-CM

## 2021-12-27 DIAGNOSIS — R1011 Right upper quadrant pain: Secondary | ICD-10-CM

## 2021-12-31 ENCOUNTER — Encounter (HOSPITAL_COMMUNITY)
Admission: RE | Admit: 2021-12-31 | Discharge: 2021-12-31 | Disposition: A | Discharge status: Home / Self Care | Payer: BC Managed Care – PPO | Source: Ambulatory Visit | Attending: Family Medicine | Admitting: Family Medicine

## 2021-12-31 ENCOUNTER — Encounter (HOSPITAL_COMMUNITY): Payer: Self-pay

## 2021-12-31 ENCOUNTER — Other Ambulatory Visit: Payer: Self-pay

## 2021-12-31 DIAGNOSIS — R110 Nausea: Secondary | ICD-10-CM

## 2021-12-31 DIAGNOSIS — R1011 Right upper quadrant pain: Secondary | ICD-10-CM

## 2021-12-31 LAB — HCG, SERUM, QUALITATIVE: Preg, Serum: NEGATIVE

## 2022-01-01 DIAGNOSIS — F418 Other specified anxiety disorders: Secondary | ICD-10-CM | POA: Diagnosis not present

## 2022-01-01 DIAGNOSIS — Z0000 Encounter for general adult medical examination without abnormal findings: Secondary | ICD-10-CM | POA: Diagnosis not present

## 2022-03-20 DIAGNOSIS — F4323 Adjustment disorder with mixed anxiety and depressed mood: Secondary | ICD-10-CM | POA: Diagnosis not present

## 2022-03-20 DIAGNOSIS — F340 Cyclothymic disorder: Secondary | ICD-10-CM | POA: Diagnosis not present

## 2022-03-20 DIAGNOSIS — F900 Attention-deficit hyperactivity disorder, predominantly inattentive type: Secondary | ICD-10-CM | POA: Diagnosis not present

## 2022-04-30 DIAGNOSIS — Z1159 Encounter for screening for other viral diseases: Secondary | ICD-10-CM | POA: Diagnosis not present

## 2022-04-30 DIAGNOSIS — Z01419 Encounter for gynecological examination (general) (routine) without abnormal findings: Secondary | ICD-10-CM | POA: Diagnosis not present

## 2022-04-30 DIAGNOSIS — Z113 Encounter for screening for infections with a predominantly sexual mode of transmission: Secondary | ICD-10-CM | POA: Diagnosis not present

## 2022-04-30 DIAGNOSIS — Z114 Encounter for screening for human immunodeficiency virus [HIV]: Secondary | ICD-10-CM | POA: Diagnosis not present

## 2022-05-29 DIAGNOSIS — F4323 Adjustment disorder with mixed anxiety and depressed mood: Secondary | ICD-10-CM | POA: Diagnosis not present

## 2022-05-29 DIAGNOSIS — F411 Generalized anxiety disorder: Secondary | ICD-10-CM | POA: Diagnosis not present

## 2022-05-29 DIAGNOSIS — F900 Attention-deficit hyperactivity disorder, predominantly inattentive type: Secondary | ICD-10-CM | POA: Diagnosis not present

## 2022-05-29 DIAGNOSIS — F340 Cyclothymic disorder: Secondary | ICD-10-CM | POA: Diagnosis not present

## 2022-06-05 DIAGNOSIS — F340 Cyclothymic disorder: Secondary | ICD-10-CM | POA: Diagnosis not present

## 2022-06-05 DIAGNOSIS — F4323 Adjustment disorder with mixed anxiety and depressed mood: Secondary | ICD-10-CM | POA: Diagnosis not present

## 2022-06-05 DIAGNOSIS — F411 Generalized anxiety disorder: Secondary | ICD-10-CM | POA: Diagnosis not present

## 2022-06-05 DIAGNOSIS — F900 Attention-deficit hyperactivity disorder, predominantly inattentive type: Secondary | ICD-10-CM | POA: Diagnosis not present

## 2022-06-19 DIAGNOSIS — F4323 Adjustment disorder with mixed anxiety and depressed mood: Secondary | ICD-10-CM | POA: Diagnosis not present

## 2022-06-19 DIAGNOSIS — F340 Cyclothymic disorder: Secondary | ICD-10-CM | POA: Diagnosis not present

## 2022-06-19 DIAGNOSIS — F900 Attention-deficit hyperactivity disorder, predominantly inattentive type: Secondary | ICD-10-CM | POA: Diagnosis not present

## 2022-06-19 DIAGNOSIS — F411 Generalized anxiety disorder: Secondary | ICD-10-CM | POA: Diagnosis not present

## 2022-07-03 DIAGNOSIS — F900 Attention-deficit hyperactivity disorder, predominantly inattentive type: Secondary | ICD-10-CM | POA: Diagnosis not present

## 2022-07-03 DIAGNOSIS — F411 Generalized anxiety disorder: Secondary | ICD-10-CM | POA: Diagnosis not present

## 2022-07-03 DIAGNOSIS — F4323 Adjustment disorder with mixed anxiety and depressed mood: Secondary | ICD-10-CM | POA: Diagnosis not present

## 2022-07-03 DIAGNOSIS — F340 Cyclothymic disorder: Secondary | ICD-10-CM | POA: Diagnosis not present

## 2022-07-17 DIAGNOSIS — F340 Cyclothymic disorder: Secondary | ICD-10-CM | POA: Diagnosis not present

## 2022-07-17 DIAGNOSIS — F900 Attention-deficit hyperactivity disorder, predominantly inattentive type: Secondary | ICD-10-CM | POA: Diagnosis not present

## 2022-07-17 DIAGNOSIS — F411 Generalized anxiety disorder: Secondary | ICD-10-CM | POA: Diagnosis not present

## 2022-07-17 DIAGNOSIS — F4323 Adjustment disorder with mixed anxiety and depressed mood: Secondary | ICD-10-CM | POA: Diagnosis not present

## 2022-07-24 DIAGNOSIS — F900 Attention-deficit hyperactivity disorder, predominantly inattentive type: Secondary | ICD-10-CM | POA: Diagnosis not present

## 2022-07-24 DIAGNOSIS — F340 Cyclothymic disorder: Secondary | ICD-10-CM | POA: Diagnosis not present

## 2022-07-24 DIAGNOSIS — F4322 Adjustment disorder with anxiety: Secondary | ICD-10-CM | POA: Diagnosis not present

## 2022-07-24 DIAGNOSIS — F411 Generalized anxiety disorder: Secondary | ICD-10-CM | POA: Diagnosis not present

## 2022-07-31 DIAGNOSIS — F411 Generalized anxiety disorder: Secondary | ICD-10-CM | POA: Diagnosis not present

## 2022-07-31 DIAGNOSIS — F900 Attention-deficit hyperactivity disorder, predominantly inattentive type: Secondary | ICD-10-CM | POA: Diagnosis not present

## 2022-07-31 DIAGNOSIS — F340 Cyclothymic disorder: Secondary | ICD-10-CM | POA: Diagnosis not present

## 2022-07-31 DIAGNOSIS — F4322 Adjustment disorder with anxiety: Secondary | ICD-10-CM | POA: Diagnosis not present

## 2022-08-05 DIAGNOSIS — F4322 Adjustment disorder with anxiety: Secondary | ICD-10-CM | POA: Diagnosis not present

## 2022-08-05 DIAGNOSIS — F340 Cyclothymic disorder: Secondary | ICD-10-CM | POA: Diagnosis not present

## 2022-08-05 DIAGNOSIS — F411 Generalized anxiety disorder: Secondary | ICD-10-CM | POA: Diagnosis not present

## 2022-08-05 DIAGNOSIS — F900 Attention-deficit hyperactivity disorder, predominantly inattentive type: Secondary | ICD-10-CM | POA: Diagnosis not present

## 2022-08-20 DIAGNOSIS — F411 Generalized anxiety disorder: Secondary | ICD-10-CM | POA: Diagnosis not present

## 2022-08-20 DIAGNOSIS — F340 Cyclothymic disorder: Secondary | ICD-10-CM | POA: Diagnosis not present

## 2022-08-20 DIAGNOSIS — F4322 Adjustment disorder with anxiety: Secondary | ICD-10-CM | POA: Diagnosis not present

## 2022-08-20 DIAGNOSIS — F900 Attention-deficit hyperactivity disorder, predominantly inattentive type: Secondary | ICD-10-CM | POA: Diagnosis not present

## 2022-08-24 IMAGING — US US BREAST*L* LIMITED INC AXILLA
1 series · 7 of 7 positions shown · non-contrast
Comparison: None.

CLINICAL DATA: Patient presents for palpable abnormality within the
right breast.

EXAM:
DIGITAL DIAGNOSTIC BILATERAL MAMMOGRAM WITH CAD AND TOMO
ULTRASOUND BILATERAL BREAST

[Series 1: us breast*left* limited inc axilla · 0.07mm/px · 7 of 7 slices shown]
[im 1/7]
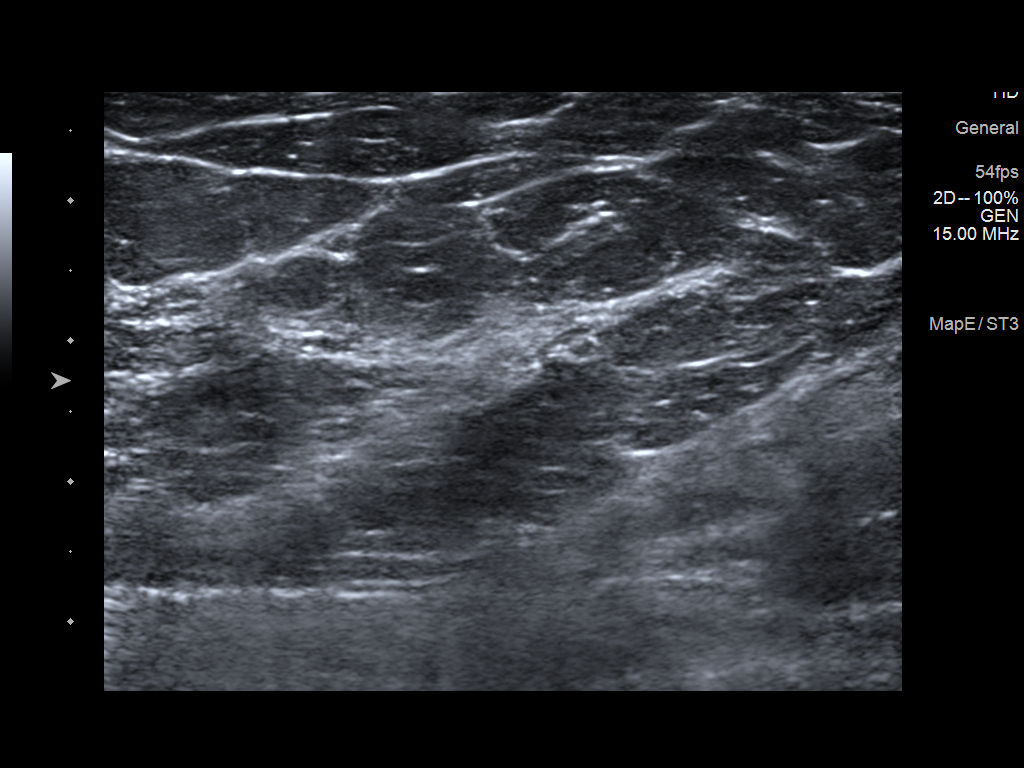
[im 2/7]
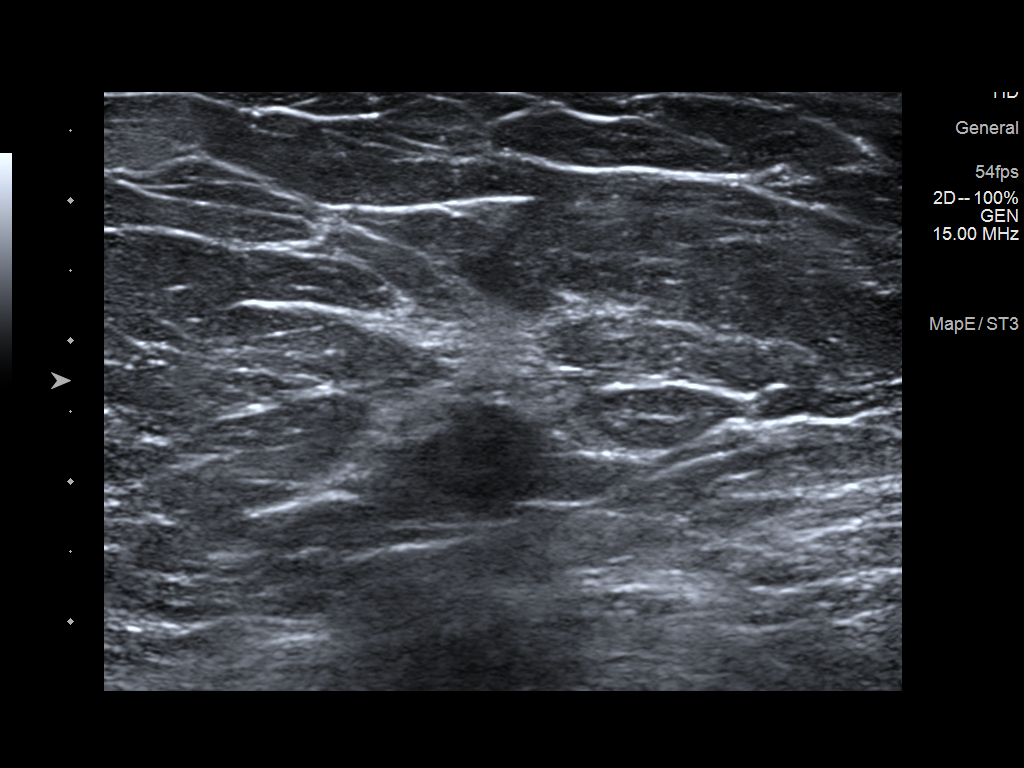
[im 3/7]
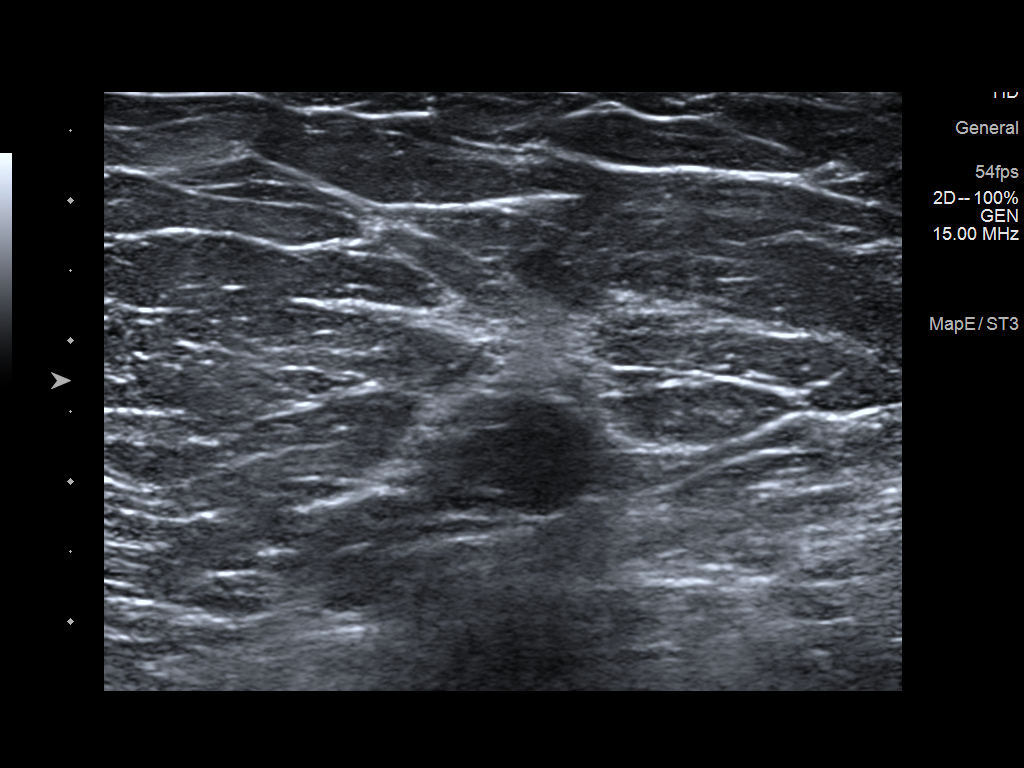
[im 4/7]
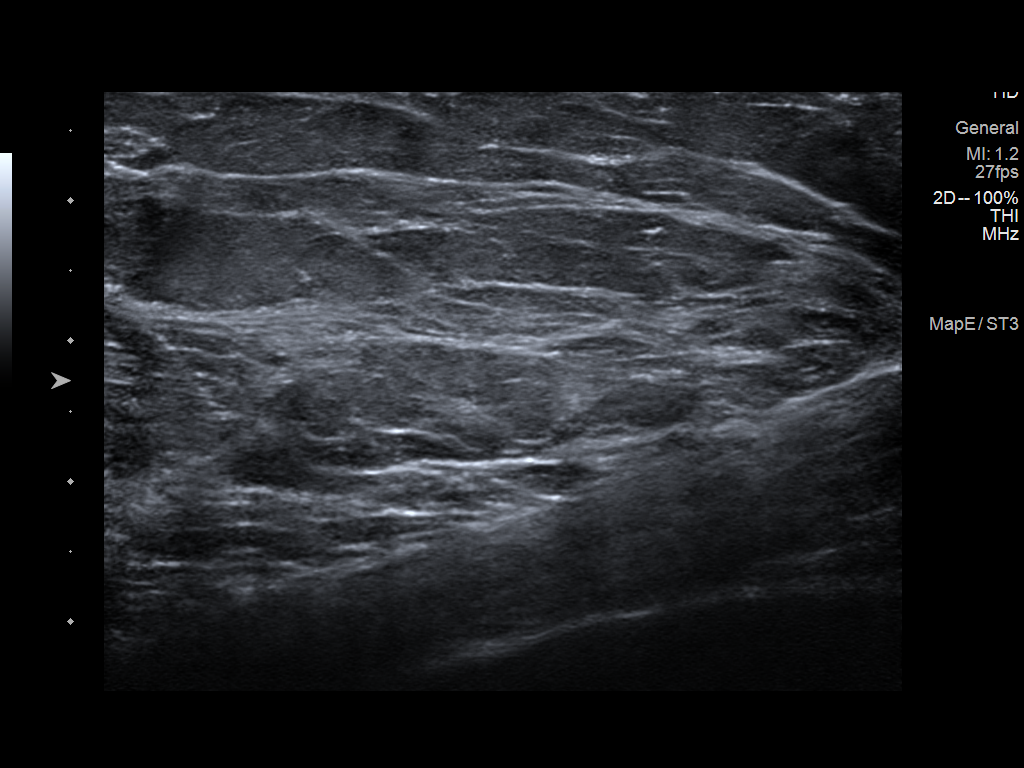
[im 5/7]
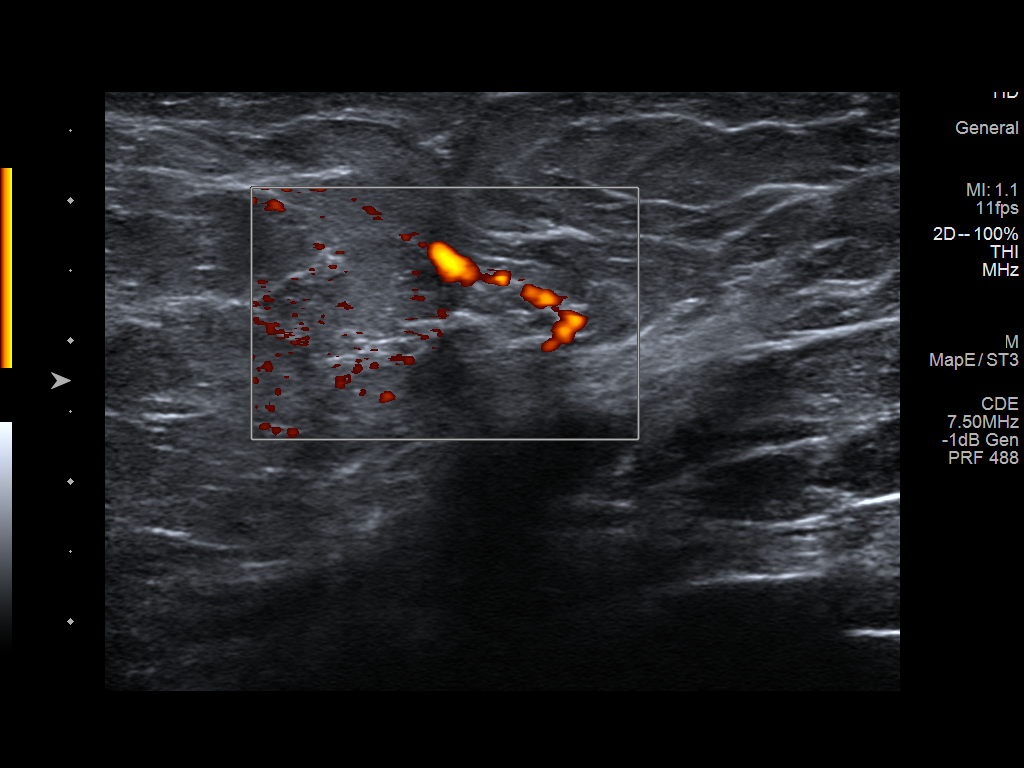
[im 6/7]
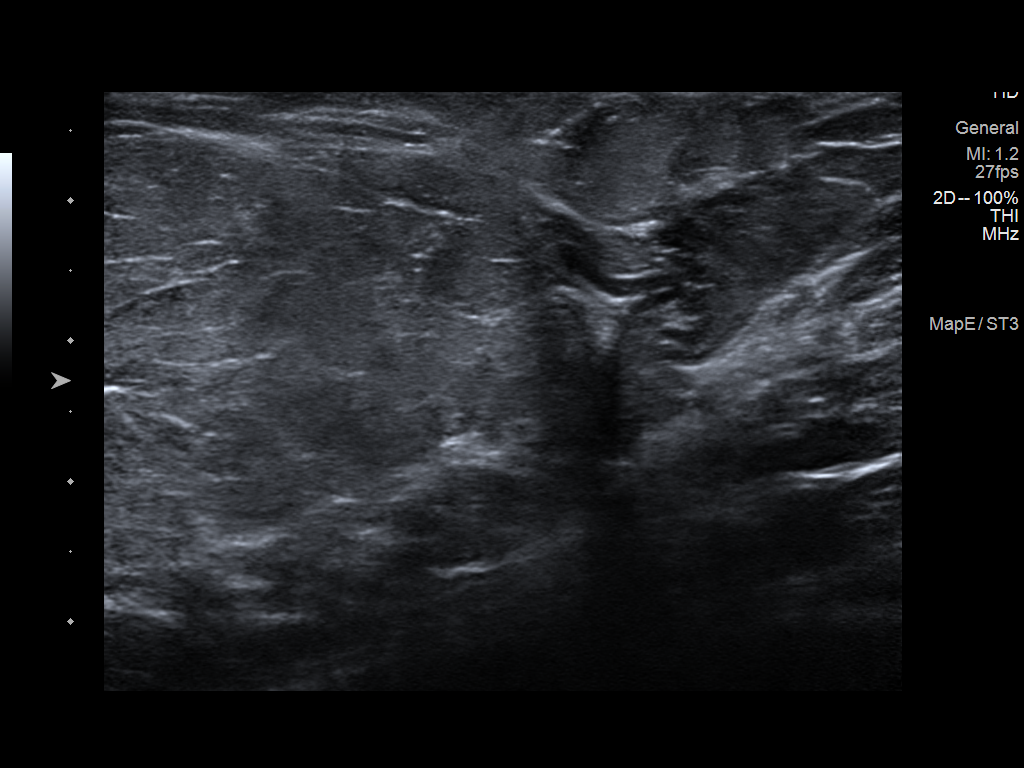
[im 7/7]
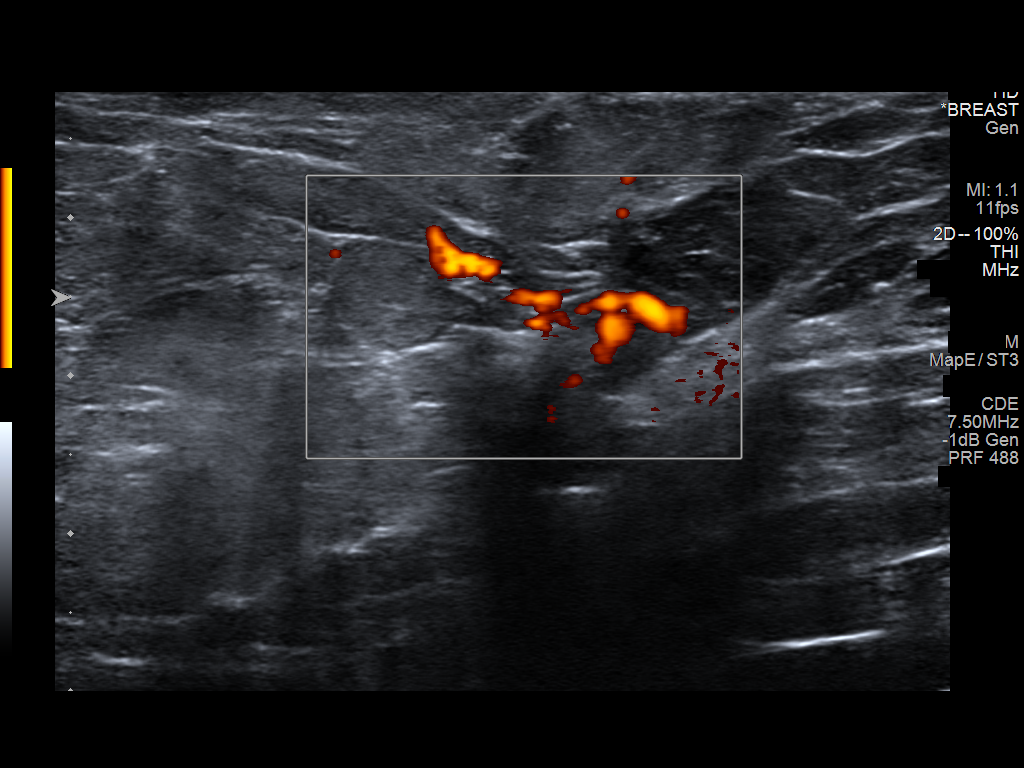

[7 of 7 positions shown; findings below may reference images not displayed]

ACR Breast Density Category b: There are scattered areas of
fibroglandular density.
FINDINGS: Small oval mass underlying the palpable marker within the inferior
medial right breast.

Dense tissue within the outer left breast without definitive mass or
suspicious abnormality on additional imaging.

No additional findings within either breast.

Mammographic images were processed with CAD.

On physical exam, a small mass is palpated within the lower inner
right breast.

Targeted ultrasound is performed, showing a 2.7 x 1.4 x 1.5 cm mixed
echogenicity predominantly hypoechoic mass right breast 3 o'clock
position 11 cm from the nipple at the site of palpable concern,
favored to represent an inflamed sebaceous cyst. There appears to be
a small tract communicating with the overlying skin.

No suspicious abnormality within the outer aspect of the left
breast. Dense tissue is visualized.
IMPRESSION: Palpable abnormality within right breast favored to represent an
inflamed sebaceous cyst.

RECOMMENDATION:
Recommend application of warm compresses. Patient will return in 2
months for repeat right breast ultrasound. If the mass appears the
same, consider surgical consultation for drainage.

I have discussed the findings and recommendations with the patient.
If applicable, a reminder letter will be sent to the patient
regarding the next appointment.

BI-RADS CATEGORY  3: Probably benign.

## 2022-09-03 DIAGNOSIS — F4323 Adjustment disorder with mixed anxiety and depressed mood: Secondary | ICD-10-CM | POA: Diagnosis not present

## 2022-09-03 DIAGNOSIS — F900 Attention-deficit hyperactivity disorder, predominantly inattentive type: Secondary | ICD-10-CM | POA: Diagnosis not present

## 2022-09-03 DIAGNOSIS — F340 Cyclothymic disorder: Secondary | ICD-10-CM | POA: Diagnosis not present

## 2022-10-02 DIAGNOSIS — F340 Cyclothymic disorder: Secondary | ICD-10-CM | POA: Diagnosis not present

## 2022-10-02 DIAGNOSIS — F900 Attention-deficit hyperactivity disorder, predominantly inattentive type: Secondary | ICD-10-CM | POA: Diagnosis not present

## 2022-10-02 DIAGNOSIS — F4322 Adjustment disorder with anxiety: Secondary | ICD-10-CM | POA: Diagnosis not present

## 2022-10-02 DIAGNOSIS — F411 Generalized anxiety disorder: Secondary | ICD-10-CM | POA: Diagnosis not present

## 2022-12-01 DIAGNOSIS — F340 Cyclothymic disorder: Secondary | ICD-10-CM | POA: Diagnosis not present

## 2022-12-01 DIAGNOSIS — F4323 Adjustment disorder with mixed anxiety and depressed mood: Secondary | ICD-10-CM | POA: Diagnosis not present

## 2022-12-01 DIAGNOSIS — F900 Attention-deficit hyperactivity disorder, predominantly inattentive type: Secondary | ICD-10-CM | POA: Diagnosis not present

## 2022-12-29 DIAGNOSIS — R946 Abnormal results of thyroid function studies: Secondary | ICD-10-CM | POA: Diagnosis not present

## 2022-12-29 DIAGNOSIS — R7309 Other abnormal glucose: Secondary | ICD-10-CM | POA: Diagnosis not present

## 2022-12-29 DIAGNOSIS — Z13220 Encounter for screening for lipoid disorders: Secondary | ICD-10-CM | POA: Diagnosis not present

## 2022-12-29 DIAGNOSIS — Z79899 Other long term (current) drug therapy: Secondary | ICD-10-CM | POA: Diagnosis not present

## 2023-01-05 DIAGNOSIS — R7309 Other abnormal glucose: Secondary | ICD-10-CM | POA: Diagnosis not present

## 2023-01-05 DIAGNOSIS — Z0000 Encounter for general adult medical examination without abnormal findings: Secondary | ICD-10-CM | POA: Diagnosis not present

## 2023-02-25 DIAGNOSIS — H5713 Ocular pain, bilateral: Secondary | ICD-10-CM | POA: Diagnosis not present

## 2023-03-20 DIAGNOSIS — F4323 Adjustment disorder with mixed anxiety and depressed mood: Secondary | ICD-10-CM | POA: Diagnosis not present

## 2023-03-20 DIAGNOSIS — F340 Cyclothymic disorder: Secondary | ICD-10-CM | POA: Diagnosis not present

## 2023-03-20 DIAGNOSIS — F900 Attention-deficit hyperactivity disorder, predominantly inattentive type: Secondary | ICD-10-CM | POA: Diagnosis not present

## 2023-06-09 DIAGNOSIS — Z20822 Contact with and (suspected) exposure to covid-19: Secondary | ICD-10-CM | POA: Diagnosis not present

## 2023-06-09 DIAGNOSIS — J069 Acute upper respiratory infection, unspecified: Secondary | ICD-10-CM | POA: Diagnosis not present

## 2023-06-09 DIAGNOSIS — J029 Acute pharyngitis, unspecified: Secondary | ICD-10-CM | POA: Diagnosis not present

## 2023-07-06 DIAGNOSIS — Z01419 Encounter for gynecological examination (general) (routine) without abnormal findings: Secondary | ICD-10-CM | POA: Diagnosis not present

## 2023-07-06 DIAGNOSIS — N943 Premenstrual tension syndrome: Secondary | ICD-10-CM | POA: Diagnosis not present

## 2023-08-17 DIAGNOSIS — F900 Attention-deficit hyperactivity disorder, predominantly inattentive type: Secondary | ICD-10-CM | POA: Diagnosis not present

## 2023-08-17 DIAGNOSIS — F4323 Adjustment disorder with mixed anxiety and depressed mood: Secondary | ICD-10-CM | POA: Diagnosis not present

## 2023-08-17 DIAGNOSIS — F340 Cyclothymic disorder: Secondary | ICD-10-CM | POA: Diagnosis not present

## 2023-11-10 DIAGNOSIS — N898 Other specified noninflammatory disorders of vagina: Secondary | ICD-10-CM | POA: Diagnosis not present

## 2023-11-10 DIAGNOSIS — Z1159 Encounter for screening for other viral diseases: Secondary | ICD-10-CM | POA: Diagnosis not present

## 2023-11-10 DIAGNOSIS — Z113 Encounter for screening for infections with a predominantly sexual mode of transmission: Secondary | ICD-10-CM | POA: Diagnosis not present

## 2024-01-07 DIAGNOSIS — M79604 Pain in right leg: Secondary | ICD-10-CM | POA: Diagnosis not present

## 2024-01-07 DIAGNOSIS — R600 Localized edema: Secondary | ICD-10-CM | POA: Diagnosis not present

## 2024-01-07 DIAGNOSIS — I10 Essential (primary) hypertension: Secondary | ICD-10-CM | POA: Diagnosis not present

## 2024-01-08 DIAGNOSIS — R600 Localized edema: Secondary | ICD-10-CM | POA: Diagnosis not present

## 2024-01-11 DIAGNOSIS — Z79899 Other long term (current) drug therapy: Secondary | ICD-10-CM | POA: Diagnosis not present

## 2024-01-11 DIAGNOSIS — R7309 Other abnormal glucose: Secondary | ICD-10-CM | POA: Diagnosis not present

## 2024-01-11 DIAGNOSIS — Z13220 Encounter for screening for lipoid disorders: Secondary | ICD-10-CM | POA: Diagnosis not present

## 2024-01-11 DIAGNOSIS — R946 Abnormal results of thyroid function studies: Secondary | ICD-10-CM | POA: Diagnosis not present

## 2024-01-18 DIAGNOSIS — Z0000 Encounter for general adult medical examination without abnormal findings: Secondary | ICD-10-CM | POA: Diagnosis not present

## 2024-01-18 DIAGNOSIS — M7989 Other specified soft tissue disorders: Secondary | ICD-10-CM | POA: Diagnosis not present

## 2024-07-19 DIAGNOSIS — Z01419 Encounter for gynecological examination (general) (routine) without abnormal findings: Secondary | ICD-10-CM | POA: Diagnosis not present

## 2024-07-20 DIAGNOSIS — R7309 Other abnormal glucose: Secondary | ICD-10-CM | POA: Diagnosis not present

## 2024-07-20 DIAGNOSIS — Z79899 Other long term (current) drug therapy: Secondary | ICD-10-CM | POA: Diagnosis not present

## 2024-07-27 DIAGNOSIS — M25561 Pain in right knee: Secondary | ICD-10-CM | POA: Diagnosis not present

## 2024-07-27 DIAGNOSIS — R7309 Other abnormal glucose: Secondary | ICD-10-CM | POA: Diagnosis not present

## 2024-07-27 DIAGNOSIS — R600 Localized edema: Secondary | ICD-10-CM | POA: Diagnosis not present

## 2024-07-27 DIAGNOSIS — R946 Abnormal results of thyroid function studies: Secondary | ICD-10-CM | POA: Diagnosis not present

## 2024-08-16 DIAGNOSIS — M545 Low back pain, unspecified: Secondary | ICD-10-CM | POA: Diagnosis not present

## 2024-08-16 DIAGNOSIS — M62830 Muscle spasm of back: Secondary | ICD-10-CM | POA: Diagnosis not present
